# Patient Record
Sex: Female | Born: 1980 | Race: White | Hispanic: No | Marital: Married | Smoking: Never smoker
Health system: Southern US, Community
[De-identification: ages and names within clinical notes are randomized; demographics above are authoritative.]

## PROBLEM LIST (undated history)

## (undated) DIAGNOSIS — K219 Gastro-esophageal reflux disease without esophagitis: Secondary | ICD-10-CM

## (undated) DIAGNOSIS — R51 Headache: Secondary | ICD-10-CM

## (undated) DIAGNOSIS — R519 Headache, unspecified: Secondary | ICD-10-CM

## (undated) DIAGNOSIS — IMO0002 Reserved for concepts with insufficient information to code with codable children: Secondary | ICD-10-CM

## (undated) DIAGNOSIS — M7731 Calcaneal spur, right foot: Secondary | ICD-10-CM

## (undated) DIAGNOSIS — F419 Anxiety disorder, unspecified: Secondary | ICD-10-CM

## (undated) DIAGNOSIS — O039 Complete or unspecified spontaneous abortion without complication: Secondary | ICD-10-CM

## (undated) DIAGNOSIS — R112 Nausea with vomiting, unspecified: Secondary | ICD-10-CM

## (undated) DIAGNOSIS — F32A Depression, unspecified: Secondary | ICD-10-CM

## (undated) DIAGNOSIS — N809 Endometriosis, unspecified: Secondary | ICD-10-CM

## (undated) DIAGNOSIS — D171 Benign lipomatous neoplasm of skin and subcutaneous tissue of trunk: Secondary | ICD-10-CM

## (undated) DIAGNOSIS — Z9889 Other specified postprocedural states: Secondary | ICD-10-CM

## (undated) HISTORY — DX: Benign lipomatous neoplasm of skin and subcutaneous tissue of trunk: D17.1

## (undated) HISTORY — DX: Anxiety disorder, unspecified: F41.9

## (undated) HISTORY — DX: Endometriosis, unspecified: N80.9

## (undated) HISTORY — DX: Reserved for concepts with insufficient information to code with codable children: IMO0002

## (undated) HISTORY — DX: Calcaneal spur, right foot: M77.31

## (undated) HISTORY — DX: Complete or unspecified spontaneous abortion without complication: O03.9

---

## 2006-09-23 HISTORY — PX: OVARIAN CYST REMOVAL: SHX89

## 2009-05-02 HISTORY — PX: OVARIAN CYST REMOVAL: SHX89

## 2011-03-23 DIAGNOSIS — O039 Complete or unspecified spontaneous abortion without complication: Secondary | ICD-10-CM

## 2011-03-23 HISTORY — DX: Complete or unspecified spontaneous abortion without complication: O03.9

## 2011-12-15 HISTORY — PX: LEFT OOPHORECTOMY: SHX1961

## 2014-02-06 ENCOUNTER — Encounter: Payer: Self-pay | Admitting: Obstetrics & Gynecology

## 2014-02-16 ENCOUNTER — Encounter: Payer: Self-pay | Admitting: Obstetrics and Gynecology

## 2014-02-27 ENCOUNTER — Encounter: Payer: Self-pay | Admitting: Obstetrics & Gynecology

## 2014-02-27 ENCOUNTER — Ambulatory Visit (INDEPENDENT_AMBULATORY_CARE_PROVIDER_SITE_OTHER): Payer: No Typology Code available for payment source | Admitting: Obstetrics & Gynecology

## 2014-02-27 VITALS — BP 124/87 | HR 80 | Ht 63.0 in | Wt 231.0 lb

## 2014-02-27 DIAGNOSIS — N809 Endometriosis, unspecified: Secondary | ICD-10-CM

## 2014-02-27 DIAGNOSIS — Z01419 Encounter for gynecological examination (general) (routine) without abnormal findings: Secondary | ICD-10-CM

## 2014-02-27 DIAGNOSIS — Z124 Encounter for screening for malignant neoplasm of cervix: Secondary | ICD-10-CM

## 2014-02-27 DIAGNOSIS — N920 Excessive and frequent menstruation with regular cycle: Secondary | ICD-10-CM

## 2014-02-27 DIAGNOSIS — Z1151 Encounter for screening for human papillomavirus (HPV): Secondary | ICD-10-CM

## 2014-02-27 HISTORY — DX: Endometriosis, unspecified: N80.9

## 2014-02-27 NOTE — Patient Instructions (Signed)
Endometriosis Endometriosis is a condition in which the tissue that lines the uterus (endometrium) grows outside of its normal location. The tissue may grow in many locations close to the uterus, but it commonly grows on the ovaries, fallopian tubes, vagina, or bowel. Because the uterus expels, or sheds, its lining every menstrual cycle, there is bleeding wherever the endometrial tissue is located. This can cause pain because blood is irritating to tissues not normally exposed to it.  CAUSES  The cause of endometriosis is not known.  SIGNS AND SYMPTOMS  Often, there are no symptoms. When symptoms are present, they can vary with the location of the displaced tissue. Various symptoms can occur at different times. Although symptoms occur mainly during a woman's menstrual period, they can also occur midcycle and usually stop with menopause. Some people may go months with no symptoms at all. Symptoms may include:   Back or abdominal pain.   Heavier bleeding during periods.   Pain during intercourse.   Painful bowel movements.   Infertility. DIAGNOSIS  Your health care provider will do a physical exam and ask about your symptoms. Various tests may be done, such as:   Blood tests and urine tests. These are done to help rule out other problems.   Ultrasound. This test is done to look for abnormal tissue.   An X-ray of the lower bowel (barium enema).  Laparoscopy. In this procedure, a thin, lighted tube with a tiny camera on the end (laparoscope) is inserted into your abdomen. This helps your health care provider look for abnormal tissue to confirm the diagnosis. The health care provider may also remove a small piece of tissue (biopsy) from any abnormal tissue found. This tissue sample can then be sent to a lab so it can be looked at under a microscope. TREATMENT  Treatment will vary and may include:   Medicines to relieve pain. Nonsteroidal anti-inflammatory drugs (NSAIDs) are a type of  pain medicine that can help to relieve the pain caused by endometriosis.  Hormonal therapy. When using hormonal therapy, periods are eliminated. This eliminates the monthly exposure to blood by the displaced endometrial tissue.   Surgery. Surgery may sometimes be done to remove the abnormal endometrial tissue. In severe cases, surgery may be done to remove the fallopian tubes, uterus, and ovaries (hysterectomy). HOME CARE INSTRUCTIONS   Only take over-the-counter or prescription medicines for pain, discomfort, or fever as directed by your health care provider. Do not take aspirin because it may increase bleeding when you are not on hormonal therapy.   Avoid activities that produce pain, including sexual activity. SEEK MEDICAL CARE IF:  You have pelvic pain before, after, or during your periods.  You have pelvic pain between periods that gets worse during your period.  You have pelvic pain during or after sex.  You have pelvic pain with bowel movements or urination, especially during your period.  You have problems getting pregnant. SEEK IMMEDIATE MEDICAL CARE IF:   Your pain is severe and is not responding to pain medicine.   You have severe nausea and vomiting, or you cannot keep foods down.   You have pain that is limited to the right lower part of your abdomen.   You have swelling or increasing pain in your abdomen.   You see blood in your stool.   You have a fever or persistent symptoms for more than 2 3 days.   You have a fever and your symptoms suddenly get worse. MAKE SURE YOU:  Understand these instructions.  Will watch your condition.  Will get help right away if you are not doing well or get worse. Document Released: 11/27/2000 Document Revised: 09/20/2013 Document Reviewed: 07/28/2013 [employer] Patient Information 2014 [employer].  

## 2014-02-27 NOTE — Progress Notes (Signed)
Patient ID: Dawn Cain, female   DOB: 09-09-1981, 33 y.o.   MRN: [id number] Subjective:     Dawn Cain is a 33 y.o. female here for a routine exam.  Current complaints: ovulation pain.  Pt has spotting 1 week prior to her cycles.  Has had heavier bleeding with cycles.  Desires pregnancy.     Gynecologic History Patient's last menstrual period was 02/14/2014. Contraception: none Last Pap: 2013. Results were: normal h/o abnormal PAP prev ?2012 s/p colpo and bx   Obstetric History OB History  Gravida Para Term Preterm AB SAB TAB Ectopic Multiple Living  1 0 0 0 1 1 0 0 0 0     # Outcome Date GA Lbr Len/2nd Weight Sex Delivery Anes PTL Lv  1 SAB              Past Medical History  Diagnosis Date  . Infertility     trying for 11 years.  . Miscarriage 03/23/2011  h/o endometriosis  Past Surgical History  Procedure Laterality Date  . 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 Ovarian cyst removal Left 05/02/2009  . Ovarian cyst removal Left 09/23/2006   No current outpatient prescriptions on file prior to visit.   No current facility-administered medications on file prior to visit.       The following portions of the patient's history were reviewed and updated as appropriate: allergies, current medications, past family history, past medical history, past social history, past surgical history and problem list.  Review of Systems pt with h/o endometriosis    Objective:    BP 124/87  Pulse 80  Ht 5\' 3"  (1.6 m)  Wt 231 lb (104.781 kg)  BMI 40.93 kg/m2  LMP 02/14/2014  General Appearance:    Alert, cooperative, no distress, appears stated age  Head:    Normocephalic, without obvious abnormality, atraumatic              Neck:   Supple, symmetrical, trachea midline, no adenopathy;    thyroid:  no enlargement/tenderness/nodules; no carotid   bruit or JVD  Back:     Symmetric, no curvature, ROM normal, no CVA tenderness  Lungs:     Clear to  auscultation bilaterally, respirations unlabored  Chest Wall:    No tenderness or deformity   Heart:    Regular rate and rhythm, S1 and S2 normal, no murmur, rub   or gallop  Breast Exam:    No tenderness, masses, or nipple abnormality  Abdomen:     Soft, non-tender, bowel sounds active all four quadrants,    no masses, no organomegaly  Genitalia:    Normal female without lesion, discharge or tenderness     Extremities:   Extremities normal, atraumatic, no cyanosis or edema   Skin: multiple well healed marks on skin- face; breast; legs and chest                Assessment:    Healthy female exam.  irreg cycles/menorrhagia   Plan:    Follow up in: 1 year.  for annaul F/u 3 months to review sono sono to check endometrium/ r/o uterine pathology

## 2014-03-01 LAB — PAP LB, RFX HPV ALL PTH: PAP Smear Comment: 0

## 2014-03-09 ENCOUNTER — Ambulatory Visit (HOSPITAL_COMMUNITY)
Admission: RE | Admit: 2014-03-09 | Discharge: 2014-03-09 | Disposition: A | Discharge status: Home / Self Care | Payer: No Typology Code available for payment source | Source: Ambulatory Visit | Attending: Obstetrics & Gynecology | Admitting: Obstetrics & Gynecology

## 2014-03-09 DIAGNOSIS — N920 Excessive and frequent menstruation with regular cycle: Secondary | ICD-10-CM | POA: Insufficient documentation

## 2014-03-09 DIAGNOSIS — D259 Leiomyoma of uterus, unspecified: Secondary | ICD-10-CM | POA: Insufficient documentation

## 2014-03-09 DIAGNOSIS — N809 Endometriosis, unspecified: Secondary | ICD-10-CM | POA: Insufficient documentation

## 2014-03-09 DIAGNOSIS — R630 Anorexia: Secondary | ICD-10-CM | POA: Insufficient documentation

## 2014-03-15 ENCOUNTER — Telehealth: Payer: Self-pay | Admitting: *Deleted

## 2014-03-15 NOTE — Telephone Encounter (Addendum)
Message copied by Erik Obey on Thu Mar 15, 2014 10:32 AM ------      Message from: Lavonia Drafts      Created: Fri Mar 09, 2014  5:00 PM       Please call pt and notify her of normal sono.            [id number] ------Called patient and left message regarding normal results.  She will call back if she would like further detailed information.

## 2014-03-23 ENCOUNTER — Encounter: Payer: Self-pay | Admitting: Obstetrics & Gynecology

## 2014-03-23 ENCOUNTER — Ambulatory Visit (INDEPENDENT_AMBULATORY_CARE_PROVIDER_SITE_OTHER): Payer: No Typology Code available for payment source | Admitting: Obstetrics & Gynecology

## 2014-03-23 VITALS — BP 125/84 | HR 86 | Ht 63.0 in | Wt 230.0 lb

## 2014-03-23 DIAGNOSIS — N926 Irregular menstruation, unspecified: Secondary | ICD-10-CM

## 2014-03-23 MED ORDER — NORETHIN ACE-ETH ESTRAD-FE 1-20 MG-MCG(24) PO TABS: 1.0000 | ORAL_TABLET | Freq: Every day | ORAL | Status: DC

## 2014-03-23 MED ORDER — NAPROXEN 500 MG PO TABS: 500.0000 mg | ORAL_TABLET | Freq: Three times a day (TID) | ORAL | Status: DC

## 2014-03-23 NOTE — Progress Notes (Signed)
Subjective:     Patient ID: Dawn Cain, female   DOB: [birth year], 33 y.o.   MRN: [id number]  HPI 21 you [id number] presents for f/u of sono results.  She reports that her cycles are becoming shorter intervals but, she feel pain during her cycles and has 1 week of spotting prior to menses that is assoc with pain.      Review of Systems     Objective:   Physical Exam BP 125/84  Pulse 86  Ht 5\' 3"  (1.6 m)  Wt 230 lb (104.327 kg)  BMI 40.75 kg/m2  LMP 03/10/2014 Exam deferred  03/09/2014 EXAM:  TRANSABDOMINAL AND TRANSVAGINAL ULTRASOUND OF PELVIS  TECHNIQUE:  Both transabdominal and transvaginal ultrasound examinations of the  pelvis were performed. Transabdominal technique was performed for  global imaging of the pelvis including uterus, ovaries, adnexal  regions, and pelvic cul-de-sac. It was necessary to proceed with  endovaginal exam following the transabdominal exam to visualize the  endometrium and right ovary.  COMPARISON: None  FINDINGS:  Uterus  Measurements: 7.4 x 4.7 x 4.6 cm. Small 1.1 x 1.0 x 1.2 cm fibroid  left fundus.  Endometrium  Thickness:11.1 mm. Motion was noted within the endometrium  consistent with the patient's current active bleeding.  Right ovary  Measurements: 3.2 x 2.3 x 3.0 cm. Normal appearance/no adnexal mass.  Left ovary  Left Oophorectomy.  Other findings  Trace free fluid.  IMPRESSION:  1. Small 1.1 x 1.0 x 1.2 cm fibroid left fundus.  2. Patient was actively bleeding during the exam. Motion noted  within the endometrial canal. Endometrial thickness is 11.1 mm.  3. Left oophorectomy. Right ovary is unremarkable.  4. Trace free pelvic fluid.  .      Assessment:     Dysmenorrhea- h/o endometriosis  Irreg cycles- will cycle for 3 months on OCP's to attempt to regulate her cycles.  Consider Lupron if she is not able to see REI soon    Plan:     LoEstrin 24 1 po q day for 3 months Naproxen 500tid prn F/u in 4 months Pt to be referred  to [facility] as soon as she changes ins

## 2014-03-23 NOTE — Patient Instructions (Addendum)
Uterine Fibroid A uterine fibroid is a growth (tumor) that occurs in your uterus. This type of tumor is not cancerous and does not spread out of the uterus. You can have one or many fibroids. Fibroids can vary in size, weight, and where they grow in the uterus. Some can become quite large. Most fibroids do not require medical treatment, but some can cause pain or heavy bleeding during and between periods. CAUSES  A fibroid is the result of a single uterine cell that keeps growing (unregulated), which is different than most cells in the human body. Most cells have a control mechanism that keeps them from reproducing without control.  SIGNS AND SYMPTOMS   Bleeding.  Pelvic pain and pressure.  Bladder problems due to the size of the fibroid.  Infertility and miscarriages depending on the size and location of the fibroid. DIAGNOSIS  Uterine fibroids are diagnosed through a physical exam. Your health care provider may feel the lumpy tumors during a pelvic exam. Ultrasonography may be done to get information regarding size, location, and number of tumors.  TREATMENT   Your health care provider may recommend watchful waiting. This involves getting the fibroid checked by your health care provider to see if it grows or shrinks.   Hormone treatment or an intrauterine device (IUD) may be prescribed.   Surgery may be needed to remove the fibroids (myomectomy) or the uterus (hysterectomy). This depends on your situation. When fibroids interfere with fertility and a woman wants to become pregnant, a health care provider may recommend having the fibroids removed.  [place] care depends on how you were treated. In general:   Keep all follow-up appointments with your health care provider.   Only take over-the-counter or prescription medicines as directed by your health care provider. If you were prescribed a hormone treatment, take the hormone medicines exactly as directed. Do not  take aspirin. It can cause bleeding.   Talk to your health care provider about taking iron pills.  If your periods are troublesome but not so heavy, lie down with your feet raised slightly above your heart. Place cold packs on your lower abdomen.   If your periods are heavy, write down the number of pads or tampons you use per month. Bring this information to your health care provider.   Include green vegetables in your diet.  SEEK IMMEDIATE MEDICAL CARE IF:  You have pelvic pain or cramps not controlled with medicines.   You have a sudden increase in pelvic pain.   You have an increase in bleeding between and during periods.   You have excessive periods and soak tampons or pads in a half hour or less.  You feel lightheaded or have fainting episodes. Document Released: 11/27/2000 Document Revised: 09/20/2013 Document Reviewed: 06/29/2013 [facility] Patient Information 2014 [place], [place]. Dysmenorrhea Menstrual cramps (dysmenorrhea) are caused by the muscles of the uterus tightening (contracting) during a menstrual period. For some women, this discomfort is merely bothersome. For others, dysmenorrhea can be severe enough to interfere with everyday activities for a few days each month. Primary dysmenorrhea is menstrual cramps that last a couple of days when you start having menstrual periods or soon after. This often begins after a teenager starts having her period. As a woman gets older or has a baby, the cramps will usually lessen or disappear. Secondary dysmenorrhea begins later in life, lasts longer, and the pain may be stronger than primary dysmenorrhea. The pain may start before the period and  last a few days after the period.  CAUSES  Dysmenorrhea is usually caused by an underlying problem, such as:  The tissue lining the uterus grows outside of the uterus in other areas of the body (endometriosis).  The endometrial tissue, which normally lines the uterus, is found in or  grows into the muscular walls of the uterus (adenomyosis).  The pelvic blood vessels are engorged with blood just before the menstrual period (pelvic congestive syndrome).  Overgrowth of cells (polyps) in the lining of the uterus or cervix.  Falling down of the uterus (prolapse) because of loose or stretched ligaments.  Depression.  Bladder problems, infection, or inflammation.  Problems with the intestine, a tumor, or irritable bowel syndrome.  Cancer of the female organs or bladder.  A severely tipped uterus.  A very tight opening or closed cervix.  Noncancerous tumors of the uterus (fibroids).  Pelvic inflammatory disease (PID).  Pelvic scarring (adhesions) from a previous surgery.  Ovarian cyst.  An intrauterine device (IUD) used for birth control. RISK FACTORS You may be at greater risk of dysmenorrhea if:  You are younger than age 49.  You started puberty early.  You have irregular or heavy bleeding.  You have never given birth.  You have a family history of this problem.  You are a smoker. SIGNS AND SYMPTOMS   Cramping or throbbing pain in your lower abdomen.  Headaches.  Lower back pain.  Nausea or vomiting.  Diarrhea.  Sweating or dizziness.  Loose stools. DIAGNOSIS  A diagnosis is based on your history, symptoms, physical exam, diagnostic tests, or procedures. Diagnostic tests or procedures may include:  Blood tests.  Ultrasonography.  An examination of the lining of the uterus (dilation and curettage, D&C).  An examination inside your abdomen or pelvis with a scope (laparoscopy).  X-rays.  CT scan.  MRI.  An examination inside the bladder with a scope (cystoscopy).  An examination inside the intestine or stomach with a scope (colonoscopy, gastroscopy). TREATMENT  Treatment depends on the cause of the dysmenorrhea. Treatment may include:  Pain medicine prescribed by your health care provider.  Birth control pills or an IUD  with progesterone hormone in it.  Hormone replacement therapy.  Nonsteroidal anti-inflammatory drugs (NSAIDs). These may help stop the production of prostaglandins.  Surgery to remove adhesions, endometriosis, ovarian cyst, or fibroids.  Removal of the uterus (hysterectomy).  Progesterone shots to stop the menstrual period.  Cutting the nerves on the sacrum that go to the female organs (presacral neurectomy).  Electric current to the sacral nerves (sacral nerve stimulation).  Antidepressant medicine.  Psychiatric therapy, counseling, or group therapy.  Exercise and physical therapy.  Meditation and yoga therapy.  Acupuncture. HOME CARE INSTRUCTIONS   Only take over-the-counter or prescription medicines as directed by your health care provider.  Place a heating pad or hot water bottle on your lower back or abdomen. Do not sleep with the heating pad.  Use aerobic exercises, walking, swimming, biking, and other exercises to help lessen the cramping.  Massage to the lower back or abdomen may help.  Stop smoking.  Avoid alcohol and caffeine. SEEK MEDICAL CARE IF:   Your pain does not get better with medicine.  You have pain with sexual intercourse.  Your pain increases and is not controlled with medicines.  You have abnormal vaginal bleeding with your period.  You develop nausea or vomiting with your period that is not controlled with medicine. SEEK IMMEDIATE MEDICAL CARE IF:  You pass out.  Document Released: 11/30/2005 Document Revised: 08/02/2013 Document Reviewed: 05/18/2013 [facility] Patient Information 2014 [place].

## 2014-04-20 ENCOUNTER — Telehealth: Payer: Self-pay | Admitting: *Deleted

## 2014-04-20 DIAGNOSIS — N926 Irregular menstruation, unspecified: Secondary | ICD-10-CM

## 2014-04-20 MED ORDER — NORGESTIMATE-ETH ESTRADIOL 0.25-35 MG-MCG PO TABS: 1.0000 | ORAL_TABLET | Freq: Every day | ORAL | Status: DC

## 2014-04-20 NOTE — Telephone Encounter (Signed)
Patient has been taking Lo estrin for two weeks and is having increased headaches.  She would like to change to a different ocp.  Her BP is good at 116/69.  We will call in ortho cyclen for her and she will go ahead and stop her current pill and switch to the new pill.

## 2014-05-01 ENCOUNTER — Ambulatory Visit (INDEPENDENT_AMBULATORY_CARE_PROVIDER_SITE_OTHER): Payer: PRIVATE HEALTH INSURANCE

## 2014-05-01 ENCOUNTER — Ambulatory Visit (INDEPENDENT_AMBULATORY_CARE_PROVIDER_SITE_OTHER): Payer: PRIVATE HEALTH INSURANCE | Admitting: Podiatry

## 2014-05-01 ENCOUNTER — Encounter: Payer: Self-pay | Admitting: Podiatry

## 2014-05-01 VITALS — BP 174/95 | HR 85 | Resp 16 | Ht 64.0 in | Wt 230.0 lb

## 2014-05-01 DIAGNOSIS — M79609 Pain in unspecified limb: Secondary | ICD-10-CM

## 2014-05-01 DIAGNOSIS — M779 Enthesopathy, unspecified: Secondary | ICD-10-CM

## 2014-05-01 DIAGNOSIS — M79673 Pain in unspecified foot: Secondary | ICD-10-CM

## 2014-05-01 MED ORDER — TRIAMCINOLONE ACETONIDE 10 MG/ML IJ SUSP
10.0000 mg | Freq: Once | INTRAMUSCULAR | Status: AC
Start: 2014-05-01 — End: 2014-05-01
  Administered 2014-05-01: 10 mg

## 2014-05-01 NOTE — Progress Notes (Signed)
Subjective:     Patient ID: Dawn Cain, female   DOB: 1981/07/17, 33 y.o.   MRN: [id number]  Foot Pain   patient states I'm having a lot of pain at the base of my second toe left foot. For the last month or 2. I did start wearing sketchers which seemed to be a problem and I no longer wearing them but the pain continues   Review of Systems  All other systems reviewed and are negative.      Objective:   Physical Exam  Nursing note and vitals reviewed. Constitutional: She is oriented to person, place, and time.  Cardiovascular: Intact distal pulses.   Musculoskeletal: Normal range of motion.  Neurological: She is oriented to person, place, and time.  Skin: Skin is warm.   neurovascular status intact with muscle strength adequate and range of motion of the subtalar and midtarsal joint within normal limits. Patient is found to have well-developed arch and good digital perfusion and I did note intense discomfort second metatarsal phalangeal joint left foot     Assessment:     Acute capsulitis of the left second metatarsophalangeal joint    Plan:     H&P performed x-ray reviewed and today I did a proximal nerve block explained the risk of injection and aspirated the second MPJ taking a small amount of clear fluid and injected with a quarter cc of dexamethasone Kenalog and applied padding to reduce stress against the joint. Placed on Voltaren 75 mg twice a day and reappoint in 1 week for probable consideration of orthotic therapy

## 2014-05-01 NOTE — Progress Notes (Signed)
   Subjective:    Patient ID: Dawn Cain, female    DOB: 1981/01/11, 33 y.o.   MRN: [id number]  HPI Comments: Its the base of my 2nd toe that hurts on my left foot. Its been hurting for 3 - 4 weeks. The pain has remained the same. It hurts when im up on my feet. i take the naproxen and it hasnt helped.   Foot Pain      Review of Systems  Musculoskeletal:       Joint pain  All other systems reviewed and are negative.      Objective:   Physical Exam        Assessment & Plan:

## 2014-05-08 ENCOUNTER — Ambulatory Visit: Payer: Self-pay | Admitting: Podiatry

## 2014-05-08 ENCOUNTER — Ambulatory Visit (INDEPENDENT_AMBULATORY_CARE_PROVIDER_SITE_OTHER): Payer: PRIVATE HEALTH INSURANCE | Admitting: Podiatry

## 2014-05-08 ENCOUNTER — Encounter: Payer: Self-pay | Admitting: Podiatry

## 2014-05-08 DIAGNOSIS — M779 Enthesopathy, unspecified: Secondary | ICD-10-CM

## 2014-05-08 NOTE — Progress Notes (Signed)
Subjective:     Patient ID: Dawn Cain, female   DOB: [birth year], 33 y.o.   MRN: [id number]  HPI patient states the pain is better than last week but still sore with activity   Review of Systems     Objective:   Physical Exam Neurovascular status intact with significant discomfort reduction in the second metatarsophalangeal joint left with no indication of digital deformity    Assessment:     Improving capsulitis second MPJ left    Plan:     Reviewed condition and recommended long-term orthotics and scanned for custom orthotic devices to reduce stress and also discussed rigid bottom shoes

## 2014-05-18 ENCOUNTER — Encounter: Payer: Self-pay | Admitting: *Deleted

## 2014-05-18 NOTE — Progress Notes (Signed)
Sent pt post card letting her know orthotics are here.

## 2014-05-25 ENCOUNTER — Ambulatory Visit (INDEPENDENT_AMBULATORY_CARE_PROVIDER_SITE_OTHER): Payer: PRIVATE HEALTH INSURANCE | Admitting: *Deleted

## 2014-05-25 VITALS — BP 126/77 | HR 80 | Resp 16

## 2014-05-25 DIAGNOSIS — M779 Enthesopathy, unspecified: Secondary | ICD-10-CM

## 2014-05-25 NOTE — Progress Notes (Signed)
Pt presents today to pick up orthotics. Went over wearing instructions with pt.

## 2014-05-25 NOTE — Patient Instructions (Signed)

## 2014-10-31 ENCOUNTER — Encounter: Payer: Self-pay | Admitting: Advanced Practice Midwife

## 2014-10-31 ENCOUNTER — Ambulatory Visit (INDEPENDENT_AMBULATORY_CARE_PROVIDER_SITE_OTHER): Payer: 59 | Admitting: Advanced Practice Midwife

## 2014-10-31 VITALS — BP 131/77 | HR 72 | Ht 64.0 in | Wt 226.0 lb

## 2014-10-31 DIAGNOSIS — N915 Oligomenorrhea, unspecified: Secondary | ICD-10-CM

## 2014-10-31 NOTE — Progress Notes (Signed)
  Subjective:    Dawn Cain is a 33 y.o. female who presents for evaluation of menstrual periods that have been much shorter and lighter than usual cycles x 2 cycles. She has aslo not been passing clots and experiencing dysmenorrhea that she usually has w/ her cycles.  Currently periods are occurring every 4 weeks. Bleeding is light. Periods were not regular in the past occurring every 3 weeks. She had been started on OCPs for cycle control 03/2014, but stopped a few months later due to intolerance of side effects.    Patient has no relevant history of abnormal sexual development or thyroid disease. She has a Hx of infertility x 11 years, ovarian cysts and an oophorectomy for an ovarian mass. Is there a chance of pregnancy? yes. Has not taken home UPT. UPT in office today negative. Factors that may be contributory to menstrual abnormalities include: recent stressors moving.. Previous treatments for menstrual abnormalities include: oral contraceptive pills, somewhat effective.  Menstrual History: OB History    Gravida Para Term Preterm AB TAB SAB Ectopic Multiple Living   1 0 0 0 1 0 1 0 0 0       Patient's last menstrual period was 10/20/2014 (exact date).    The following portions of the patient's history were reviewed and updated as appropriate: allergies, current medications, past family history, past medical history, past social history, past surgical history and problem list.  Review of Systems Endocrine: positive for fertility problems, negative for temperature intolerance and weight change, change in hair or nails.    Objective:    BP 131/77 mmHg  Pulse 72  Ht 5\' 4"  (1.626 m)  Wt 226 lb (102.513 kg)  BMI 38.77 kg/m2  LMP 10/20/2014 (Exact Date) General:   alert, cooperative, appears stated age, no distress and moderately obese  Skin:   facial acne  HEENT:   Pt left prior to thyroid exam. No visible goiter.  Heart:   regular rate   Lab Review Urine pregnancy test and TSH     Assessment:    The patient has hypomenorrhea .    Plan:    If TSH normal, F/U PRN. If abnormal, needs thyroid panel.   Refer to Dr. Hughie Closs for infertility.  [place], [place] 10/31/2014 3:08 PM

## 2014-10-31 NOTE — Patient Instructions (Addendum)
Abnormal Uterine Bleeding Abnormal uterine bleeding can affect women at various stages in life, including teenagers, women in their reproductive years, pregnant women, and women who have reached menopause. Several kinds of uterine bleeding are considered abnormal, including:  Bleeding or spotting between periods.   Bleeding after sexual intercourse.   Bleeding that is heavier or more than normal.   Periods that last longer than usual.  Bleeding after menopause.  Many cases of abnormal uterine bleeding are minor and simple to treat, while others are more serious. Any type of abnormal bleeding should be evaluated by your health care provider. Treatment will depend on the cause of the bleeding. HOME CARE INSTRUCTIONS Monitor your condition for any changes. The following actions may help to alleviate any discomfort you are experiencing:  Avoid the use of tampons and douches as directed by your health care provider.  Change your pads frequently. You should get regular pelvic exams and Pap tests. Keep all follow-up appointments for diagnostic tests as directed by your health care provider.  SEEK MEDICAL CARE IF:   Your bleeding lasts more than 1 week.   You feel dizzy at times.  SEEK IMMEDIATE MEDICAL CARE IF:   You pass out.   You are changing pads every 15 to 30 minutes.   You have abdominal pain.  You have a fever.   You become sweaty or weak.   You are passing large blood clots from the vagina.   You start to feel nauseous and vomit. MAKE SURE YOU:   Understand these instructions.  Will watch your condition.  Will get help right away if you are not doing well or get worse. Document Released: 11/30/2005 Document Revised: 12/05/2013 Document Reviewed: 06/29/2013 [facility] Patient Information 2015 [place], [place]. This information is not intended to replace advice given to you by your health care provider. Make sure you discuss any questions you have with your  health care provider.  Infertility WHAT IS INFERTILITY?  Infertility is usually defined as not being able to get pregnant after trying for one year of regular sexual intercourse without the use of contraceptives. Or not being able to carry a pregnancy to term and have a baby. The infertility rate in the [place] is around 10%. Pregnancy is the result of a chain of events. A woman must release an egg from one of her ovaries (ovulation). The egg must be fertilized by the female sperm. Then it travels through a fallopian tube into the uterus (womb), where it attaches to the wall of the uterus and grows. A man must have enough sperm, and the sperm must join with (fertilize) the egg along the way, at the proper time. The fertilized egg must then become attached to the inside of the uterus. While this may seem simple, many things can happen to prevent pregnancy from occurring.  WHOSE PROBLEM IS IT?  About 20% of infertility cases are due to problems with the man (female factors) and 65% are due to problems with the woman (female factors). Other cases are due to a combination of female and female factors or to unknown causes.  WHAT CAUSES INFERTILITY IN MEN?  Infertility in men is often caused by problems with making enough normal sperm or getting the sperm to reach the egg. Problems with sperm may exist from birth or develop later in life, due to illness or injury. Some men produce no sperm, or produce too few sperm (oligospermia). Other problems include:  Sexual dysfunction.  Hormonal or endocrine problems.  Age. Female fertility decreases with age, but not at as young an age as female fertility.  Infection.  Congenital problems. Birth defect, such as absence of the tubes that carry the sperm (vas deferens).  Genetic/chromosomal problems.  Antisperm antibody problems.  Retrograde ejaculation (sperm go into the bladder).  Varicoceles, spematoceles, or tumors of the testicles.  Lifestyle can  influence the number and quality of a man's sperm.  Alcohol and drugs can temporarily reduce sperm quality.  Environmental toxins, including pesticides and lead, may cause some cases of infertility in men. WHAT CAUSES INFERTILITY IN WOMEN?   Problems with ovulation account for most infertility in women. Without ovulation, eggs are not available to be fertilized.  Signs of problems with ovulation include irregular menstrual periods or no periods at all.  Simple lifestyle factors, including stress, diet, or athletic training, can affect a woman's hormonal balance.  Age. Fertility begins to decrease in women in the early 60s and is worse after age 73.  Much less often, a hormonal imbalance from a serious medical problem, such as a pituitary gland tumor, thyroid or other chronic medical disease, can cause ovulation problems.  Pelvic infections.  Polycystic ovary syndrome (increase in female hormones, unable to ovulate).  Alcohol or illegal drugs.  Environmental toxins, radiation, pesticides, and certain chemicals.  Aging is an important factor in female infertility.  The ability of a woman's ovaries to produce eggs declines with age, especially after age 5. About one third of couples where the woman is over 57 will have problems with fertility.  By the time she reaches menopause when her monthly periods stop for good, a woman can no longer produce eggs or become pregnant.  Other problems can also lead to infertility in women. If the fallopian tubes are blocked at one or both ends, the egg cannot travel through the tubes into the uterus. Scar tissue (adhesions) in the pelvis may cause blocked tubes. This may result from pelvic inflammatory disease, endometriosis, or surgery for an ectopic pregnancy (fertilized egg implanted outside the uterus) or any pelvic or abdominal surgery causing adhesions.  Fibroid tumors or polyps of the uterus.  Congenital (birth defect) abnormalities of the  uterus.  Infection of the cervix (cervicitis).  Cervical stenosis (narrowing).  Abnormal cervical mucus.  Polycystic ovary syndrome.  Having sexual intercourse too often (every other day or 4 to 5 times a week).  Obesity.  Anorexia.  Poor nutrition.  Over exercising, with loss of body fat.  DES. Your mother received diethylstilbesterol hormone when pregnant with you. HOW IS INFERTILITY TESTED?  If you have been trying to have a baby without success, you may want to seek medical help. You should not wait for one year of trying before seeing a health care provider if:  You are over 35.  You have reason to believe that there may be a fertility problem. A medical evaluation may determine the reasons for a couple's infertility. Usually this process begins with:  Physical exams.  Medical histories of both partners.  Sexual histories of both partners. If there is no obvious problem, like improperly timed intercourse or absence of ovulation, tests may be needed.   For a man, testing usually begins with tests of his semen to look at:  The number of sperm.  The shape of sperm.  Movement of his sperm.  Taking a complete medical and surgical history.  Physical examination.  Check for infection of the female reproductive organs. Sometimes hormone tests are done.  For a woman, the first step in testing is to find out if she is ovulating each month. There are several ways to do this. For example, she can keep track of changes in her morning body temperature and in the texture of her cervical mucus. Another tool is a home ovulation test kit, which can be bought at drug or grocery stores.  Checks of ovulation can also be done in the doctor's office, using blood tests for hormone levels or ultrasound tests of the ovaries. If the woman is ovulating, more tests will need to be done. Some common female tests include:  Hysterosalpingogram: An x-ray of the fallopian tubes and uterus  after they are injected with dye. It shows if the tubes are open and shows the shape of the uterus.  Laparoscopy: An exam of the tubes and other female organs for disease. A lighted tube called a laparoscope is used to see inside the abdomen.  Endometrial biopsy: Sample of uterus tissue taken on the first day of the menstrual period, to see if the tissue indicates you are ovulating.  Transvaginal ultrasound: Examines the female organs.  Hysteroscopy: Uses a lighted tube to examine the cervix and inside the uterus, to see if there are any abnormalities inside the uterus. TREATMENT  Depending on the test results, different treatments can be suggested. The type of treatment depends on the cause. 85 to 90% of infertility cases are treated with drugs or surgery.   Various fertility drugs may be used for women with ovulation problems. It is important to talk with your caregiver about the drug to be used. You should understand the drug's benefits and side effects. Depending on the type of fertility drug and the dosage of the drug used, multiple births (twins or multiples) can occur in some women.  If needed, surgery can be done to repair damage to a woman's ovaries, fallopian tubes, cervix, or uterus.  Surgery or medical treatment for endometriosis or polycystic ovary syndrome. Sometimes a man has an infertility problem that can be corrected with medicine or by surgery.  Intrauterine insemination (IUI) of sperm, timed with ovulation.  Change in lifestyle, if that is the cause (lose weight, increase exercise, and stop smoking, drinking excessively, or taking illegal drugs).  Other types of surgery:  Removing growths inside and on the uterus.  Removing scar tissue from inside of the uterus.  Fixing blocked tubes.  Removing scar tissue in the pelvis and around the female organs. WHAT IS ASSISTED REPRODUCTIVE TECHNOLOGY (ART)?  Assisted reproductive technology (ART) is another form of special  methods used to help infertile couples. ART involves handling both the woman's eggs and the man's sperm. Success rates vary and depend on many factors. ART can be expensive and time-consuming. But ART has made it possible for many couples to have children that otherwise would not have been conceived. Some methods are listed below:  In vitro fertilization (IVF). IVF is often used when a woman's fallopian tubes are blocked or when a man has low sperm counts. A drug is used to stimulate the ovaries to produce multiple eggs. Once mature, the eggs are removed and placed in a culture dish with the man's sperm for fertilization. After about 40 hours, the eggs are examined to see if they have become fertilized by the sperm and are dividing into cells. These fertilized eggs (embryos) are then placed in the woman's uterus. This bypasses the fallopian tubes.  Gamete intrafallopian transfer (GIFT) is similar to IVF, but used  when the woman has at least one normal fallopian tube. Three to five eggs are placed in the fallopian tube, along with the man's sperm, for fertilization inside the woman's body.  Zygote intrafallopian transfer (ZIFT), also called tubal embryo transfer, combines IVF and GIFT. The eggs retrieved from the woman's ovaries are fertilized in the lab and placed in the fallopian tubes rather than in the uterus.  ART procedures sometimes involve the use of donor eggs (eggs from another woman) or previously frozen embryos. Donor eggs may be used if a woman has impaired ovaries or has a genetic disease that could be passed on to her baby.  When performing ART, you are at higher risk for resulting in multiple pregnancies, twins, triplets or more.  Intracytoplasma sperm injection is a procedure that injects a single sperm into the egg to fertilize it.  Embryo transplant is a procedure that starts after growing an embryo in a special media (chemical solution) developed to keep the embryo alive for 2 to 5  days, and then transplanting it into the uterus. In cases where a cause cannot be found and pregnancy does not occur, adoption may be a consideration. Document Released: 12/03/2003 Document Revised: 02/22/2012 Document Reviewed: 10/29/2009 [facility] Patient Information 2015 [place], [place]. This information is not intended to replace advice given to you by your health care provider. Make sure you discuss any questions you have with your health care provider.

## 2014-11-01 LAB — TSH: TSH: 2.3 u[IU]/mL (ref 0.450–4.500)

## 2015-02-26 ENCOUNTER — Ambulatory Visit: Payer: Self-pay | Admitting: Surgery

## 2015-06-05 ENCOUNTER — Encounter: Payer: Self-pay | Admitting: Internal Medicine

## 2015-06-05 DIAGNOSIS — M722 Plantar fascial fibromatosis: Secondary | ICD-10-CM | POA: Insufficient documentation

## 2015-06-05 DIAGNOSIS — G43829 Menstrual migraine, not intractable, without status migrainosus: Secondary | ICD-10-CM | POA: Insufficient documentation

## 2015-06-05 DIAGNOSIS — N946 Dysmenorrhea, unspecified: Secondary | ICD-10-CM | POA: Insufficient documentation

## 2015-06-05 DIAGNOSIS — D171 Benign lipomatous neoplasm of skin and subcutaneous tissue of trunk: Secondary | ICD-10-CM | POA: Insufficient documentation

## 2015-06-07 ENCOUNTER — Encounter: Payer: Self-pay | Admitting: Internal Medicine

## 2015-06-07 ENCOUNTER — Ambulatory Visit (INDEPENDENT_AMBULATORY_CARE_PROVIDER_SITE_OTHER): Payer: 59 | Admitting: Internal Medicine

## 2015-06-07 VITALS — BP 142/80 | HR 84 | Ht 64.0 in | Wt 227.8 lb

## 2015-06-07 DIAGNOSIS — G5603 Carpal tunnel syndrome, bilateral upper limbs: Secondary | ICD-10-CM

## 2015-06-07 DIAGNOSIS — G5601 Carpal tunnel syndrome, right upper limb: Secondary | ICD-10-CM | POA: Diagnosis not present

## 2015-06-07 DIAGNOSIS — G5602 Carpal tunnel syndrome, left upper limb: Secondary | ICD-10-CM | POA: Diagnosis not present

## 2015-06-07 NOTE — Progress Notes (Signed)
Date:  06/07/2015   Name:  Dawn Cain   DOB:  Apr 19, 1981   MRN:  [id number]   Chief Complaint: Hand Pain Hand Pain  The incident occurred 5 to 7 days ago. [place]. The injury mechanism was repetitive motion (while kayaking). The pain is present in the right hand and left hand. The quality of the pain is described as burning and aching. The pain radiates to the left hand and left arm. The pain has been intermittent since the incident. Associated symptoms include numbness and tingling. She has tried rest and acetaminophen for the symptoms. The treatment provided no relief (by report but the severe pain has resolved.).  She also reports intermittent symptoms in both hands,wrists and arm over the past few years.  They occur after bike riding and paddling.  Sometimes it is severe enough to radiate to her shoulders.  She has never had this evaluated.  She denies muscle weakness or permanent numbness.   Review of Systems:  Review of Systems  Musculoskeletal: Negative for joint swelling, arthralgias and neck stiffness.  Skin: Negative for color change.  Neurological: Positive for tingling and numbness. Negative for tremors and weakness.    Patient Active Problem List   Diagnosis Date Noted  . Dysmenorrhea 06/05/2015  . Plantar fasciitis 06/05/2015  . Headache, menstrual migraine 06/05/2015  . 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 Endometriosis 02/27/2014    Prior to Admission medications   Medication Sig Start Date End Date Taking? Authorizing Provider  almotriptan (AXERT) 12.5 MG tablet Take by mouth.   Yes Historical Provider, MD  Multiple Vitamin (MULTIVITAMIN) tablet Take 1 tablet by mouth daily.   Yes Historical Provider, MD  naproxen (NAPROSYN) 500 MG tablet Take 1 tablet (500 mg total) by mouth 3 (three) times daily with meals. 03/23/14  Yes Lavonia Drafts, MD    Allergies  Allergen Reactions  . Erythromycin Shortness Of Breath and Other (See Comments)  .  Imitrex  [Sumatriptan]     neck muscle tightness    Past Surgical History  Procedure Laterality Date  . 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 Ovarian cyst removal Left 05/02/2009  . Ovarian cyst removal Left 09/23/2006    History  Substance Use Topics  . Smoking status: Never Smoker   . Smokeless tobacco: Never Used  . Alcohol Use: 1.2 oz/week    2 Standard drinks or equivalent per week     Comment: rare     Medication list has been reviewed and updated.  Physical Examination:  Physical Exam  Constitutional: She appears well-developed and well-nourished. No distress.  Cardiovascular: Normal rate, regular rhythm and normal heart sounds.   Pulses:      Radial pulses are 2+ on the right side, and 2+ on the left side.  Pulmonary/Chest: Breath sounds normal. She has no wheezes.  Musculoskeletal:       Right shoulder: She exhibits no tenderness and no swelling.       Left shoulder: She exhibits no tenderness and no swelling.       Right elbow: She exhibits no swelling and no effusion.       Left elbow: She exhibits no swelling and no effusion.       Arms: Neurological: She has normal strength.    BP 142/80 mmHg  Pulse 84  Ht 5\' 4"  (1.626 m)  Wt 227 lb 12.8 oz (103.329 kg)  BMI 39.08 kg/m2  Assessment and Plan: 1. Carpal tunnel syndrome, bilateral Instructions given for wrist splint at night and Naproxen twice a day Will refer to Ortho if symptoms fail to improve   Halina Maidens, MD [facility]  06/07/2015

## 2015-06-07 NOTE — Patient Instructions (Signed)
Wear a wrist splint on each wrist at night while sleeping.  (cock-up wrist splint) Take naproxen twice a day  Carpal Tunnel Syndrome The carpal tunnel is a narrow area located on the palm side of your wrist. The tunnel is formed by the wrist bones and ligaments. Nerves, blood vessels, and tendons pass through the carpal tunnel. Repeated wrist motion or certain diseases may cause swelling within the tunnel. This swelling pinches the main nerve in the wrist (median nerve) and causes the painful hand and arm condition called carpal tunnel syndrome. CAUSES   Repeated wrist motions.  Wrist injuries.  Certain diseases like arthritis, diabetes, alcoholism, hyperthyroidism, and kidney failure.  Obesity.  Pregnancy. SYMPTOMS   A "pins and needles" feeling in your fingers or hand, especially in your thumb, index and middle fingers.  Tingling or numbness in your fingers or hand.  An aching feeling in your entire arm, especially when your wrist and elbow are bent for long periods of time.  Wrist pain that goes up your arm to your shoulder.  Pain that goes down into your palm or fingers.  A weak feeling in your hands. DIAGNOSIS  Your health care provider will take your history and perform a physical exam. An electromyography test may be needed. This test measures electrical signals sent out by your nerves into the muscles. The electrical signals are usually slowed by carpal tunnel syndrome. You may also need X-rays. TREATMENT  Carpal tunnel syndrome may clear up by itself. Your health care provider may recommend a wrist splint or medicine such as a nonsteroidal anti-inflammatory medicine. Cortisone injections may help. Sometimes, surgery may be needed to free the pinched nerve.  HOME CARE INSTRUCTIONS   Take all medicine as directed by your health care provider. Only take over-the-counter or prescription medicines for pain, discomfort, or fever as directed by your health care provider.  If  you were given a splint to keep your wrist from bending, wear it as directed. It is important to wear the splint at night. Wear the splint for as long as you have pain or numbness in your hand, arm, or wrist. This may take 1 to 2 months.  Rest your wrist from any activity that may be causing your pain. If your symptoms are work-related, you may need to talk to your employer about changing to a job that does not require using your wrist.  Put ice on your wrist after long periods of wrist activity.  Put ice in a plastic bag.  Place a towel between your skin and the bag.  Leave the ice on for 15-20 minutes, 03-04 times a day.  Keep all follow-up visits as directed by your health care provider. This includes any orthopedic referrals, physical therapy, and rehabilitation. Any delay in getting necessary care could result in a delay or failure of your condition to heal. SEEK IMMEDIATE MEDICAL CARE IF:   You have new, unexplained symptoms.  Your symptoms get worse and are not helped or controlled with medicines. MAKE SURE YOU:   Understand these instructions.  Will watch your condition.  Will get help right away if you are not doing well or get worse. Document Released: 11/27/2000 Document Revised: 04/16/2014 Document Reviewed: 10/16/2011 [facility] Patient Information 2015 [place], [place]. This information is not intended to replace advice given to you by your health care provider. Make sure you discuss any questions you have with your health care provider.

## 2015-08-23 ENCOUNTER — Encounter: Payer: Self-pay | Admitting: Internal Medicine

## 2015-08-23 ENCOUNTER — Ambulatory Visit (INDEPENDENT_AMBULATORY_CARE_PROVIDER_SITE_OTHER): Payer: 59 | Admitting: Internal Medicine

## 2015-08-23 ENCOUNTER — Ambulatory Visit: Payer: Self-pay | Admitting: Internal Medicine

## 2015-08-23 VITALS — BP 110/70 | HR 86 | Temp 99.4°F | Ht 64.0 in | Wt 226.6 lb

## 2015-08-23 DIAGNOSIS — L03211 Cellulitis of face: Secondary | ICD-10-CM | POA: Diagnosis not present

## 2015-08-23 MED ORDER — AMOXICILLIN-POT CLAVULANATE 875-125 MG PO TABS: 1.0000 | ORAL_TABLET | Freq: Two times a day (BID) | ORAL | Status: DC

## 2015-08-23 NOTE — Progress Notes (Signed)
Date:  08/23/2015   Name:  Dawn Cain   DOB:  [birth year]   MRN:  [id number]   Chief Complaint: Facial Swelling  patient noted a pimple on her right cheek about a week ago. She's been keeping it clean scrubbing it daily. She is not tried to express any material. Yesterday it became more swollen and tender. This morning when she woke up her right eye was a little puffy in her cheek was more swollen and red. She denies any fever or chills sinus pains. No visual impairment. There's been no drainage from the cheek region.  Review of Systems:  Review of Systems  Constitutional: Negative for fever.  Eyes: Negative for pain and visual disturbance.  Respiratory: Negative for shortness of breath.   Cardiovascular: Negative for chest pain.  Skin: Positive for color change and wound.    Patient Active Problem List   Diagnosis Date Noted  . Dysmenorrhea 06/05/2015  . Plantar fasciitis 06/05/2015  . Headache, menstrual migraine 06/05/2015  . 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 Endometriosis 02/27/2014    Prior to Admission medications   Medication Sig Start Date End Date Taking? Authorizing Provider  almotriptan (AXERT) 12.5 MG tablet Take by mouth.   Yes Historical Provider, MD  Multiple Vitamin (MULTIVITAMIN) tablet Take 1 tablet by mouth daily.   Yes Historical Provider, MD  naproxen (NAPROSYN) 500 MG tablet Take 1 tablet (500 mg total) by mouth 3 (three) times daily with meals. 03/23/14  Yes Lavonia Drafts, MD    Allergies  Allergen Reactions  . Erythromycin Shortness Of Breath and Other (See Comments)  . Imitrex  [Sumatriptan]     neck muscle tightness    Past Surgical History  Procedure Laterality Date  . 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 Ovarian cyst removal Left 05/02/2009  . Ovarian cyst removal Left 09/23/2006    Social History  Substance Use Topics  . Smoking status: Never Smoker   . Smokeless tobacco: Never Used  . Alcohol Use: 1.2  oz/week    2 Standard drinks or equivalent per week     Comment: rare     Medication list has been reviewed and updated.  Physical Examination:  Physical Exam  Constitutional: She is oriented to person, place, and time. She appears well-developed. No distress.  HENT:  Head: Normocephalic and atraumatic.  Eyes: Conjunctivae are normal. Right eye exhibits no discharge. Left eye exhibits no discharge. No scleral icterus.  Pulmonary/Chest: Effort normal. No respiratory distress.  Musculoskeletal: Normal range of motion.  Lymphadenopathy:    She has no cervical adenopathy.  Neurological: She is alert and oriented to person, place, and time.  Skin: Skin is warm and dry. No rash noted. There is erythema.  Induration of the right cheek with some erythema and tenderness. No discrete masses appreciated. No evidence of abscess. There is a small central pore without drainage.  Psychiatric: She has a normal mood and affect. Her behavior is normal. Thought content normal.    BP 110/70 mmHg  Pulse 86  Temp(Src) 99.4 F (37.4 C)  Ht 5\' 4"  (1.626 m)  Wt 226 lb 9.6 oz (102.785 kg)  BMI 38.88 kg/m2  SpO2 100%  Assessment and Plan: 1. Cellulitis of face Wash the face gently daily avoid excessive irritation. May use warm compresses if necessary may take ibuprofen or Aleve for discomfort. Follow-up if symptoms don't improve within the next 3 days - amoxicillin-clavulanate (AUGMENTIN) 875-125 MG  per tablet; Take 1 tablet by mouth 2 (two) times daily.  Dispense: 20 tablet; Refill: 0   Halina Maidens, MD [facility]  08/23/2015

## 2015-10-16 ENCOUNTER — Other Ambulatory Visit: Payer: Self-pay | Admitting: *Deleted

## 2015-10-16 ENCOUNTER — Ambulatory Visit (INDEPENDENT_AMBULATORY_CARE_PROVIDER_SITE_OTHER): Payer: 59 | Admitting: General Surgery

## 2015-10-16 ENCOUNTER — Encounter: Payer: Self-pay | Admitting: General Surgery

## 2015-10-16 VITALS — BP 165/88 | HR 80 | Temp 98.6°F | Ht 64.0 in | Wt 231.0 lb

## 2015-10-16 DIAGNOSIS — D171 Benign lipomatous neoplasm of skin and subcutaneous tissue of trunk: Secondary | ICD-10-CM

## 2015-10-16 NOTE — Addendum Note (Signed)
Addended by: Clayburn Pert T on: 10/16/2015 03:37 PM   Modules accepted: Orders

## 2015-10-16 NOTE — Progress Notes (Signed)
Outpatient Surgical Follow Up  10/16/2015  Dawn Cain is an 34 y.o. female.   Chief Complaint  Patient presents with  . Lipoma    surgical consult    HPI: 34 year old female is status post surgery department has been previously seen by [name] returns to clinic for evaluation of a lower back lipoma that she been previously evaluated for. Patient states that since she was last seen by Dr. [name] that she's had increased symptoms from the soft tissue mass on her lower back. She doesn't think it has increased in size and at this time. However it is bothering her more often at work. She is interested in having it removed. She denies any fevers, chills, nausea, vomiting, diarrhea, constipation, shortness of breath or chest pain.  Past Medical History  Diagnosis Date  . Infertility     trying for 11 years.  . Miscarriage 03/23/2011  . Lipoma of back     Past Surgical History  Procedure Laterality Date  . 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 Ovarian cyst removal Left 05/02/2009  . Ovarian cyst removal Left 09/23/2006    Family History  Problem Relation Age of Onset  . Hypertension Father   . Emphysema Maternal Grandmother   . Cancer Maternal Grandfather     Lung  . Heart disease Paternal Grandmother   . Cancer Paternal Grandfather     stomach    Social History:  reports that she has never smoked. She has never used smokeless tobacco. She reports that she drinks about 1.2 oz of alcohol per week. She reports that she does not use illicit drugs.  Allergies:  Allergies  Allergen Reactions  . Erythromycin Shortness Of Breath and Other (See Comments)  . Imitrex  [Sumatriptan]     neck muscle tightness    Medications reviewed.    ROS  A multipoint review of systems was completed. All pertinent positives negatives within the history of present illness remainder were negative.  BP 165/88 mmHg  Pulse 80  Temp(Src) 98.6 F (37 C) (Oral)  Ht 5'  4" (1.626 m)  Wt 104.781 kg (231 lb)  BMI 39.63 kg/m2  LMP 09/23/2015  Physical Exam  Gen.: No acute distress Chest: Clear to auscultation Heart: Regular rhythm Abdomen: Soft, nontender, distended Skin: Obvious soft tissue mass of lower back. Approximately 8 cm tall by 6 cm wide.  No results found for this or any previous visit (from the past 48 hour(s)). No results found. MRI that performed in March of this year reviewed. Soft tissue mass correlating with palpated mass with associated measurements revealed likely lipoma. Assessment/Plan:  1. Lipoma of back 34 year old female with a soft tissue mass of the lower back. Discussed the procedure of surgical removal in detail with the patient include the risks, benefits, alternatives. Patient voiced understanding wished proceed. We will plan for excision of lipoma in the operating room on November 17. All questions answered.     Clayburn Pert  10/16/2015,negative

## 2015-10-16 NOTE — Patient Instructions (Signed)
You have been tentatively scheduled for surgery on 10/31/15 for lipoma removal. Our surgery scheduler will be in contact with you to provide additional information about your procedure time. Please call our office if you have any questions.

## 2015-10-17 ENCOUNTER — Encounter: Payer: Self-pay | Admitting: *Deleted

## 2015-10-23 ENCOUNTER — Telehealth: Payer: Self-pay

## 2015-10-23 ENCOUNTER — Other Ambulatory Visit: Payer: Self-pay

## 2015-10-23 ENCOUNTER — Telehealth: Payer: Self-pay | Admitting: General Surgery

## 2015-10-23 NOTE — Telephone Encounter (Signed)
I have left a message on patients voicemail to advised patient of pre op date/time and sx date. Sx: 10/31/15 with Dr Tresa Moore of lipoma on back. Pre op: 10/23/15 between 9-1--phone.

## 2015-10-23 NOTE — Telephone Encounter (Signed)
Patient called at this time. States that she was supposed to have a Pre-admit interview over the phone at [facility] but no one has called her. I do not see any notes in chart regarding this. Can you please call her back as soon as possible please?

## 2015-10-24 ENCOUNTER — Telehealth: Payer: Self-pay | Admitting: General Surgery

## 2015-10-24 NOTE — Telephone Encounter (Signed)
Patient has called back and was informed of surgery date and pre op date and time.

## 2015-10-24 NOTE — Telephone Encounter (Signed)
Patient has been called and informed of surgery date and pre op date.

## 2015-10-24 NOTE — Telephone Encounter (Signed)
Patient has called back and was informed of surgery date and time as well as pre op. Pre op appointment--phone--has been changed to 10/25/15 between 1-5.

## 2015-10-25 ENCOUNTER — Encounter: Payer: Self-pay | Admitting: *Deleted

## 2015-10-25 ENCOUNTER — Other Ambulatory Visit: Payer: Self-pay

## 2015-10-25 NOTE — Patient Instructions (Signed)
  Your procedure is scheduled on:10-31-15 Report to [facility] To find out your arrival time please call [phone] between 1PM - 3PM on 10-30-15  Remember: Instructions that are not followed completely may result in serious medical risk, up to and including death, or upon the discretion of your surgeon and anesthesiologist your surgery may need to be rescheduled.    _X___ 1. Do not eat food or drink liquids after midnight. No gum chewing or hard candies.     _X___ 2. No Alcohol for 24 hours before or after surgery.   ____ 3. Bring all medications with you on the day of surgery if instructed.    ____ 4. Notify your doctor if there is any change in your medical condition     (cold, fever, infections).     Do not wear jewelry, make-up, hairpins, clips or nail polish.  Do not wear lotions, powders, or perfumes. You may wear deodorant.  Do not shave 48 hours prior to surgery. Men may shave face and neck.  Do not bring valuables to the hospital.    [facility] is not responsible for any belongings or valuables.               Contacts, dentures or bridgework may not be worn into surgery.  Leave your suitcase in the car. After surgery it may be brought to your room.  For patients admitted to the hospital, discharge time is determined by your  treatment team.   Patients discharged the day of surgery will not be allowed to drive home.   Please read over the following fact sheets that you were given:      ____ Take these medicines the morning of surgery with A SIP OF WATER:    1. NONE  2.   3.   4.  5.  6.  ____ Fleet Enema (as directed)   ____ Use CHG Soap as directed  ____ Use inhalers on the day of surgery  ____ Stop metformin 2 days prior to surgery    ____ Take 1/2 of usual insulin dose the night before surgery and none on the morning of surgery.   ____ Stop Coumadin/Plavix/aspirin-N/A  _X___ Stop Anti-inflammatories-STOP NAPROXEN NOW-NO  NSAIDS OR ASA PRODUCTS-TYLENOL    ____ Stop supplements until after surgery.    ____ Bring C-Pap to the hospital.

## 2015-10-28 MED ORDER — IPRATROPIUM-ALBUTEROL 0.5-2.5 (3) MG/3ML IN SOLN
RESPIRATORY_TRACT | Status: AC
  Filled 2015-10-28: qty 3

## 2015-10-31 ENCOUNTER — Ambulatory Visit: Payer: 59 | Admitting: Anesthesiology

## 2015-10-31 ENCOUNTER — Ambulatory Visit
Admission: RE | Admit: 2015-10-31 | Discharge: 2015-10-31 | Disposition: A | Discharge status: Home / Self Care | Payer: 59 | Source: Ambulatory Visit | Attending: General Surgery | Admitting: General Surgery

## 2015-10-31 ENCOUNTER — Encounter
Admission: RE | Disposition: A | Discharge status: Home / Self Care | Payer: Self-pay | Source: Ambulatory Visit | Attending: General Surgery

## 2015-10-31 ENCOUNTER — Encounter: Payer: Self-pay | Admitting: *Deleted

## 2015-10-31 DIAGNOSIS — Z90721 Acquired absence of ovaries, unilateral: Secondary | ICD-10-CM | POA: Diagnosis not present

## 2015-10-31 DIAGNOSIS — Z881 Allergy status to other antibiotic agents status: Secondary | ICD-10-CM | POA: Insufficient documentation

## 2015-10-31 DIAGNOSIS — Z825 Family history of asthma and other chronic lower respiratory diseases: Secondary | ICD-10-CM | POA: Diagnosis not present

## 2015-10-31 DIAGNOSIS — Z8249 Family history of ischemic heart disease and other diseases of the circulatory system: Secondary | ICD-10-CM | POA: Diagnosis not present

## 2015-10-31 DIAGNOSIS — N979 Female infertility, unspecified: Secondary | ICD-10-CM | POA: Diagnosis not present

## 2015-10-31 DIAGNOSIS — Z800 Family history of malignant neoplasm of digestive organs: Secondary | ICD-10-CM | POA: Diagnosis not present

## 2015-10-31 DIAGNOSIS — D171 Benign lipomatous neoplasm of skin and subcutaneous tissue of trunk: Secondary | ICD-10-CM | POA: Insufficient documentation

## 2015-10-31 DIAGNOSIS — D179 Benign lipomatous neoplasm, unspecified: Secondary | ICD-10-CM | POA: Diagnosis not present

## 2015-10-31 DIAGNOSIS — Z801 Family history of malignant neoplasm of trachea, bronchus and lung: Secondary | ICD-10-CM | POA: Diagnosis not present

## 2015-10-31 DIAGNOSIS — Z888 Allergy status to other drugs, medicaments and biological substances status: Secondary | ICD-10-CM | POA: Insufficient documentation

## 2015-10-31 HISTORY — DX: Headache: R51

## 2015-10-31 HISTORY — DX: Headache, unspecified: R51.9

## 2015-10-31 HISTORY — PX: LIPOMA EXCISION: SHX5283

## 2015-10-31 LAB — POCT PREGNANCY, URINE: PREG TEST UR: NEGATIVE

## 2015-10-31 SURGERY — EXCISION LIPOMA
Anesthesia: General | Laterality: Left | Wound class: Clean

## 2015-10-31 MED ORDER — BUPIVACAINE HCL (PF) 0.5 % IJ SOLN
INTRAMUSCULAR | Status: AC
  Filled 2015-10-31: qty 30

## 2015-10-31 MED ORDER — CEFAZOLIN SODIUM-DEXTROSE 2-3 GM-% IV SOLR
INTRAVENOUS | Status: AC
  Administered 2015-10-31: 2 g via INTRAVENOUS
  Filled 2015-10-31: qty 50

## 2015-10-31 MED ORDER — HYDROMORPHONE HCL 1 MG/ML IJ SOLN
INTRAMUSCULAR | Status: DC | PRN
  Administered 2015-10-31: .4 mg via INTRAVENOUS

## 2015-10-31 MED ORDER — CHLORHEXIDINE GLUCONATE 4 % EX LIQD: 1.0000 "application " | Freq: Once | CUTANEOUS | Status: DC

## 2015-10-31 MED ORDER — BUPIVACAINE HCL (PF) 0.5 % IJ SOLN
INTRAMUSCULAR | Status: DC | PRN
  Administered 2015-10-31: 20 mL

## 2015-10-31 MED ORDER — LIDOCAINE HCL (PF) 1 % IJ SOLN
INTRAMUSCULAR | Status: DC | PRN
  Administered 2015-10-31: 20 mL

## 2015-10-31 MED ORDER — LIDOCAINE HCL (PF) 1 % IJ SOLN
INTRAMUSCULAR | Status: AC
  Filled 2015-10-31: qty 30

## 2015-10-31 MED ORDER — ONDANSETRON HCL 4 MG/2ML IJ SOLN
INTRAMUSCULAR | Status: DC | PRN
  Administered 2015-10-31: 4 mg via INTRAVENOUS

## 2015-10-31 MED ORDER — FAMOTIDINE 20 MG PO TABS
ORAL_TABLET | ORAL | Status: AC
  Administered 2015-10-31: 20 mg via ORAL
  Filled 2015-10-31: qty 1

## 2015-10-31 MED ORDER — FENTANYL CITRATE (PF) 100 MCG/2ML IJ SOLN
INTRAMUSCULAR | Status: DC | PRN
  Administered 2015-10-31 (×2): 25 ug via INTRAVENOUS
  Administered 2015-10-31: 50 ug via INTRAVENOUS

## 2015-10-31 MED ORDER — CEFAZOLIN SODIUM-DEXTROSE 2-3 GM-% IV SOLR: 2.0000 g | INTRAVENOUS | Status: DC

## 2015-10-31 MED ORDER — HYDROCODONE-ACETAMINOPHEN 5-325 MG PO TABS: 1.0000 | ORAL_TABLET | Freq: Four times a day (QID) | ORAL | Status: DC | PRN

## 2015-10-31 MED ORDER — FENTANYL CITRATE (PF) 100 MCG/2ML IJ SOLN: 25.0000 ug | INTRAMUSCULAR | Status: DC | PRN

## 2015-10-31 MED ORDER — ONDANSETRON HCL 4 MG/2ML IJ SOLN: 4.0000 mg | Freq: Once | INTRAMUSCULAR | Status: DC | PRN

## 2015-10-31 MED ORDER — FAMOTIDINE 20 MG PO TABS
20.0000 mg | ORAL_TABLET | Freq: Once | ORAL | Status: AC
  Administered 2015-10-31: 20 mg via ORAL

## 2015-10-31 MED ORDER — PROPOFOL 500 MG/50ML IV EMUL
INTRAVENOUS | Status: DC | PRN
  Administered 2015-10-31: 10 ug via INTRAVENOUS
  Administered 2015-10-31: 15 ug via INTRAVENOUS
  Administered 2015-10-31: 10 ug via INTRAVENOUS
  Administered 2015-10-31: 20 ug via INTRAVENOUS
  Administered 2015-10-31: 10 ug via INTRAVENOUS

## 2015-10-31 MED ORDER — LACTATED RINGERS IV SOLN
INTRAVENOUS | Status: DC
  Administered 2015-10-31 (×2): via INTRAVENOUS

## 2015-10-31 SURGICAL SUPPLY — 27 items
BLADE SURG 15 STRL LF DISP TIS (BLADE) ×1 IMPLANT
BLADE SURG 15 STRL SS (BLADE) ×2
CHLORAPREP W/TINT 26ML (MISCELLANEOUS) ×3 IMPLANT
DRAPE LAPAROTOMY 100X77 ABD (DRAPES) ×3 IMPLANT
GLOVE BIO SURGEON STRL SZ7.5 (GLOVE) ×3 IMPLANT
GLOVE INDICATOR 8.0 STRL GRN (GLOVE) ×3 IMPLANT
GOWN STRL REUS W/ TWL LRG LVL3 (GOWN DISPOSABLE) ×2 IMPLANT
GOWN STRL REUS W/TWL LRG LVL3 (GOWN DISPOSABLE) ×4
JACKSON PRATT 7MM (INSTRUMENTS) IMPLANT
KIT RM TURNOVER STRD PROC AR (KITS) ×3 IMPLANT
LABEL OR SOLS (LABEL) ×3 IMPLANT
LIQUID BAND (GAUZE/BANDAGES/DRESSINGS) ×3 IMPLANT
NDL SAFETY 25GX1.5 (NEEDLE) ×3 IMPLANT
NS IRRIG 500ML POUR BTL (IV SOLUTION) ×3 IMPLANT
PACK BASIN MINOR ARMC (MISCELLANEOUS) ×3 IMPLANT
PAD GROUND ADULT SPLIT (MISCELLANEOUS) ×3 IMPLANT
SPONGE DRAIN TRACH 4X4 STRL 2S (GAUZE/BANDAGES/DRESSINGS) ×3 IMPLANT
SUT ETHILON 3-0 FS-10 30 BLK (SUTURE)
SUT MNCRL 4-0 (SUTURE) ×2
SUT MNCRL 4-0 27XMFL (SUTURE) ×1
SUT VIC AB 2-0 CT2 27 (SUTURE) ×3 IMPLANT
SUT VIC AB 3-0 SH 27 (SUTURE) ×2
SUT VIC AB 3-0 SH 27X BRD (SUTURE) ×1 IMPLANT
SUTURE EHLN 3-0 FS-10 30 BLK (SUTURE) IMPLANT
SUTURE MNCRL 4-0 27XMF (SUTURE) ×1 IMPLANT
SYR BULB EAR ULCER 3OZ GRN STR (SYRINGE) IMPLANT
SYRINGE 10CC LL (SYRINGE) ×3 IMPLANT

## 2015-10-31 NOTE — H&P (View-Only) (Signed)
Outpatient Surgical Follow Up  10/16/2015  Dawn Cain is an 34 y.o. female.   Chief Complaint  Patient presents with  . Lipoma    surgical consult    HPI: 34-year-old female is status post surgery department has been previously seen by Dawn Cain returns to clinic for evaluation of a lower back lipoma that she been previously evaluated for. Patient states that since she was last seen by Dr. [name] that she's had increased symptoms from the soft tissue mass on her lower back. She doesn't think it has increased in size and at this time. However it is bothering her more often at work. She is interested in having it removed. She denies any fevers, chills, nausea, vomiting, diarrhea, constipation, shortness of breath or chest pain.  Past Medical History  Diagnosis Date  . Infertility     trying for 11 years.  . Miscarriage 03/23/2011  . Lipoma of back     Past Surgical History  Procedure Laterality Date  . 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 Ovarian cyst removal Left 05/02/2009  . Ovarian cyst removal Left 09/23/2006    Family History  Problem Relation Age of Onset  . Hypertension Father   . Emphysema Maternal Grandmother   . Cancer Maternal Grandfather     Lung  . Heart disease Paternal Grandmother   . Cancer Paternal Grandfather     stomach    Social History:  reports that she has never smoked. She has never used smokeless tobacco. She reports that she drinks about 1.2 oz of alcohol per week. She reports that she does not use illicit drugs.  Allergies:  Allergies  Allergen Reactions  . Erythromycin Shortness Of Breath and Other (See Comments)  . Imitrex  [Sumatriptan]     neck muscle tightness    Medications reviewed.    ROS  A multipoint review of systems was completed. All pertinent positives negatives within the history of present illness remainder were negative.  BP 165/88 mmHg  Pulse 80  Temp(Src) 98.6 F (37 C) (Oral)  Ht 5'  4" (1.626 m)  Wt 104.781 kg (231 lb)  BMI 39.63 kg/m2  LMP 09/23/2015  Physical Exam  Gen.: No acute distress Chest: Clear to auscultation Heart: Regular rhythm Abdomen: Soft, nontender, distended Skin: Obvious soft tissue mass of lower back. Approximately 8 cm tall by 6 cm wide.  No results found for this or any previous visit (from the past 48 hour(s)). No results found. MRI that performed in March of this year reviewed. Soft tissue mass correlating with palpated mass with associated measurements revealed likely lipoma. Assessment/Plan:  1. Lipoma of back 34-year-old female with a soft tissue mass of the lower back. Discussed the procedure of surgical removal in detail with the patient include the risks, benefits, alternatives. Patient voiced understanding wished proceed. We will plan for excision of lipoma in the operating room on November 17. All questions answered.     [name]  10/16/2015,negative   

## 2015-10-31 NOTE — Brief Op Note (Signed)
10/31/2015  12:18 PM  PATIENT:  Dawn Cain  34 y.o. female  PRE-OPERATIVE DIAGNOSIS:  Left lower back lipoma  POST-OPERATIVE DIAGNOSIS:  Left lower back lipoma  PROCEDURE:  Procedure(s): EXCISION LIPOMA (Left)  SURGEON:  Surgeon(s) and Role:    * Clayburn Pert, MD - Primary  PHYSICIAN ASSISTANT:   ASSISTANTS: none   ANESTHESIA:   local  EBL:   50ml  BLOOD ADMINISTERED:none  DRAINS: none   LOCAL MEDICATIONS USED:  MARCAINE   , XYLOCAINE  and Amount: 40 ml  SPECIMEN:  Source of Specimen:  lower back lipoma  DISPOSITION OF SPECIMEN:  PATHOLOGY  COUNTS:  YES  TOURNIQUET:  * No tourniquets in log *  DICTATION: .Dragon Dictation  PLAN OF CARE: Discharge to home after PACU  PATIENT DISPOSITION:  PACU - hemodynamically stable.   Delay start of Pharmacological VTE agent (>24hrs) due to surgical blood loss or risk of bleeding: not applicable

## 2015-10-31 NOTE — Anesthesia Preprocedure Evaluation (Addendum)
Anesthesia Evaluation  Patient identified by MRN, date of birth, ID band Patient awake    Reviewed: Allergy & Precautions, NPO status , Patient's Chart, lab work & pertinent test results  Airway Mallampati: II       Dental no notable dental hx.    Pulmonary neg pulmonary ROS,    Pulmonary exam normal breath sounds clear to auscultation       Cardiovascular negative cardio ROS Normal cardiovascular exam     Neuro/Psych  Headaches, negative psych ROS   GI/Hepatic negative GI ROS, Neg liver ROS,   Endo/Other  negative endocrine ROS  Renal/GU negative Renal ROS  negative genitourinary   Musculoskeletal negative musculoskeletal ROS (+)   Abdominal Normal abdominal exam  (+)   Peds negative pediatric ROS (+)  Hematology negative hematology ROS (+)   Anesthesia Other Findings   Reproductive/Obstetrics                            Anesthesia Physical Anesthesia Plan  ASA: II  Anesthesia Plan: General   Post-op Pain Management:    Induction: Intravenous  Airway Management Planned: Nasal Cannula  Additional Equipment:   Intra-op Plan:   Post-operative Plan: Extubation in OR  Informed Consent: I have reviewed the patients History and Physical, chart, labs and discussed the procedure including the risks, benefits and alternatives for the proposed anesthesia with the patient or authorized representative who has indicated his/her understanding and acceptance.   Dental advisory given  Plan Discussed with: CRNA and Surgeon  Anesthesia Plan Comments:        Anesthesia Quick Evaluation

## 2015-10-31 NOTE — Transfer of Care (Signed)
Immediate Anesthesia Transfer of Care Note  Patient: Dawn Cain  Procedure(s) Performed: Procedure(s): EXCISION LIPOMA (Left)  Patient Location: PACU  Anesthesia Type:MAC  Level of Consciousness: awake, alert  and oriented  Airway & Oxygen Therapy: Patient Spontanous Breathing and Patient connected to nasal cannula oxygen  Post-op Assessment: Report given to RN and Post -op Vital signs reviewed and stable  Post vital signs: Reviewed  Last Vitals:  Filed Vitals:   10/31/15 0922  BP: 117/60  Pulse: 89  Temp: 36.9 C  Resp: 16    Complications: No apparent anesthesia complications

## 2015-10-31 NOTE — Op Note (Signed)
  10/31/2015  12:19 PM  PATIENT:  Dawn Cain  34 y.o. female  PRE-OPERATIVE DIAGNOSIS:  Left lower back lipoma  POST-OPERATIVE DIAGNOSIS:  Same  PROCEDURE:  Excisional biopsy of left lower back lipoma  SURGEON:  Surgeon(s) and Role:    * Clayburn Pert, MD - Primary  ASSISTANTS: None  ANESTHESIA: IV sedation and local   INDICATIONS FOR PROCEDURE symptomatic lipoma  DICTATION: Patient was identified in the preoperative holding area where her consent, H&P were both confirmed. Site was marked and all questions were answered. Patient wished proceed with a lipoma excision.  Patient taken to the operating room where she is placed in the prone position and IV sedation was provided. Patient was then prepped and draped in standard sterile fashion. We then performed a timeout that correctly identified the patient procedure take precautions and proceeded a left lower back lipoma excision.  The palpable lipoma was remarked with a marking pen. A local solution of 0.5% Marcaine and 1% lidocaine plain was instilled around the entire area for a total of 20 mL's use preoperatively. The skin was incised with a 10 blade scalpel and Bovie much cautery was used to take down the incision until the lipoma was visualized. Lipoma was freed up circumferentially with Bovie much cautery until is able to be removed from the incision. It was removed in toto in one piece and was noted to have multiple lobules going in all directions.  The entire surgical site was then irrigated with normal saline. Meticulous hemostasis was assured with direct elect cautery. The incision was then closed in layers. A deep fascial layer of 2-0 Vicryl was placed in an interrupted fashion. The dermal layer of 3-0 Vicryl to place interrupted fashion. The skin was reapproximated with a running subarticular 4 Monocryl and sealed with local band.  Patient was awoken from IV sedation and transferred to PACU in good condition. All counts are  correct the end of the procedure there were no immediate, complications.  Specimen: Lipoma  Complications: None   Clayburn Pert, MD

## 2015-10-31 NOTE — Interval H&P Note (Signed)
History and Physical Interval Note:  10/31/2015 11:00 AM  Dawn Cain  has presented today for surgery, with the diagnosis of Left lower back lipoma  The various methods of treatment have been discussed with the patient and family. After consideration of risks, benefits and other options for treatment, the patient has consented to  Procedure(s): EXCISION LIPOMA (Left) as a surgical intervention .  The patient's history has been reviewed, patient examined, no change in status, stable for surgery.  I have reviewed the patient's chart and labs.  Questions were answered to the patient's satisfaction.     Clayburn Pert

## 2015-10-31 NOTE — Progress Notes (Signed)
Dr. Adonis Huguenin in to see patient 125 pm

## 2015-10-31 NOTE — Discharge Instructions (Signed)
Excision of Skin Lesions, Care After Refer to this sheet in the next few weeks. These instructions provide you with information about caring for yourself after your procedure. Your health care provider may also give you more specific instructions. Your treatment has been planned according to current medical practices, but problems sometimes occur. Call your health care provider if you have any problems or questions after your procedure. WHAT TO EXPECT AFTER THE PROCEDURE After your procedure, it is common to have pain or discomfort at the excision site. HOME CARE INSTRUCTIONS  Take over-the-counter and prescription medicines only as told by your health care provider.  Follow instructions from your health care provider about:  How to take care of your excision site. You should keep the site clean, dry, and protected for at least 48 hours.  When and how you should change your bandage (dressing).  When you should remove your dressing.  Removing whatever was used to close your excision site.  Check the excision area every day for signs of infection. Watch for:  Redness, swelling, or pain.  Fluid, blood, or pus.  For bleeding, apply gentle but firm pressure to the area using a folded towel for 20 minutes.  Avoid high-impact exercise and activities until the stitches (sutures) are removed or the area heals.  Follow instructions from your health care provider about how to minimize scarring. Avoid sun exposure until the area has healed. Scarring should lessen over time.  Keep all follow-up visits as told by your health care provider. This is important. SEEK MEDICAL CARE IF:  You have a fever.  You have redness, swelling, or pain at the excision site.  You have fluid, blood, or pus coming from the excision site.  You have ongoing bleeding at the excision site.  You have pain that does not improve in 2-3 days after your procedure.  You notice skin irregularities or changes in  sensation.   This information is not intended to replace advice given to you by your health care provider. Make sure you discuss any questions you have with your health care provider.   Document Released: 04/16/2015 Document Reviewed: 04/16/2015 Elsevier Interactive Patient Education 2016 [place]   1) The drugs that you were given will stay in your system until tomorrow so for the next 24 hours you should not:  A) Drive an automobile B) Make any legal decisions C) Drink any alcoholic beverage   2) You may resume regular meals tomorrow.  Today it is better to start with liquids and gradually work up to solid foods.  You may eat anything you prefer, but it is better to start with liquids, then soup and crackers, and gradually work up to solid foods.   3) Please notify your doctor immediately if you have any unusual bleeding, trouble breathing, redness and pain at the surgery site, drainage, fever, or pain not relieved by medication.    4) Additional Instructions:        Please contact your physician with any problems or Same Day Surgery at [phone], Monday through Friday 6 am to 4 pm, or [place] at [facility] number at [phone].

## 2015-11-01 LAB — SURGICAL PATHOLOGY

## 2015-11-01 NOTE — Anesthesia Postprocedure Evaluation (Signed)
  Anesthesia Post-op Note  Patient: Dawn Cain  Procedure(s) Performed: Procedure(s): EXCISION LIPOMA (Left)  Anesthesia type:General  Patient location: PACU  Post pain: Pain level controlled  Post assessment: Post-op Vital signs reviewed, Patient's Cardiovascular Status Stable, Respiratory Function Stable, Patent Airway and No signs of Nausea or vomiting  Post vital signs: Reviewed and stable  Last Vitals:  Filed Vitals:   10/31/15 1314  BP: 123/74  Pulse: 82  Temp: 36.2 C  Resp: 16    Level of consciousness: awake, alert  and patient cooperative  Complications: No apparent anesthesia complications

## 2015-11-21 ENCOUNTER — Ambulatory Visit: Payer: Self-pay | Admitting: General Surgery

## 2015-11-22 ENCOUNTER — Encounter: Payer: Self-pay | Admitting: General Surgery

## 2015-11-22 ENCOUNTER — Ambulatory Visit (INDEPENDENT_AMBULATORY_CARE_PROVIDER_SITE_OTHER): Payer: 59 | Admitting: General Surgery

## 2015-11-22 VITALS — BP 139/79 | HR 74 | Temp 98.3°F | Ht 64.0 in | Wt 231.0 lb

## 2015-11-22 DIAGNOSIS — Z09 Encounter for follow-up examination after completed treatment for conditions other than malignant neoplasm: Secondary | ICD-10-CM

## 2015-11-22 NOTE — Progress Notes (Signed)
Outpatient Surgical Follow Up  11/22/2015  Dawn Cain is an 34 y.o. female.   Chief Complaint  Patient presents with  . Routine Post Op    Lipoma Excision 10/31/2015 Dr. Adonis Huguenin    HPI: 34 year old female returns to clinic 3 weeks status post excision of lower back lipoma. Patient reports doing well. She denies any pain. She denies any fevers, chills, nausea, vomiting, diarrhea, constipation. She is very happy with her surgical care.  Past Medical History  Diagnosis Date  . Infertility     trying for 11 years.  . Miscarriage 03/23/2011  . Lipoma of back   . Headache     Past Surgical History  Procedure Laterality Date  . 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 Ovarian cyst removal Left 05/02/2009  . Ovarian cyst removal Left 09/23/2006  . Lipoma excision Left 10/31/2015    Procedure: EXCISION LIPOMA;  Surgeon: Clayburn Pert, MD;  Location: [facility] ORS;  Service: General;  Laterality: Left;    Family History  Problem Relation Age of Onset  . Hypertension Father   . Emphysema Maternal Grandmother   . Cancer Maternal Grandfather     Lung  . Heart disease Paternal Grandmother   . Cancer Paternal Grandfather     stomach    Social History:  reports that she has never smoked. She has never used smokeless tobacco. She reports that she drinks about 1.2 oz of alcohol per week. She reports that she does not use illicit drugs.  Allergies:  Allergies  Allergen Reactions  . Erythromycin Shortness Of Breath and Other (See Comments)  . Imitrex  [Sumatriptan]     neck muscle tightness    Medications reviewed.    ROS A multipoint review of systems was completed. All pertinent positives negatives within the history of present illness remainder were negative.   BP 139/79 mmHg  Pulse 74  Temp(Src) 98.3 F (36.8 C) (Oral)  Ht 5\' 4"  (1.626 m)  Wt 104.781 kg (231 lb)  BMI 39.63 kg/m2  Physical Exam  Gen.: No acute distress taxine chest:  Clear to auscultation Heart: Regular rate and rhythm Abdomen: Soft, nontender, nondistended Skin: Well approximated and well-healed lower back lipoma excision site. No evidence of erythema, drainage, fluid collection.   No results found for this or any previous visit (from the past 48 hour(s)). No results found.  Assessment/Plan:  1. Follow-up examination following surgery 34 year old female status post lower back lipoma excision. Discussed signs and symptoms of infection as well as wound care precautions. She voiced understanding we'll follow up on an as-needed basis.     Clayburn Pert, MD FACS General Surgeon  11/22/2015,3:57 PM

## 2016-02-04 ENCOUNTER — Ambulatory Visit (INDEPENDENT_AMBULATORY_CARE_PROVIDER_SITE_OTHER): Payer: 59 | Admitting: Obstetrics and Gynecology

## 2016-02-04 ENCOUNTER — Encounter: Payer: Self-pay | Admitting: Obstetrics and Gynecology

## 2016-02-04 VITALS — BP 118/77 | HR 84 | Resp 18 | Ht 64.0 in | Wt 237.0 lb

## 2016-02-04 DIAGNOSIS — N6310 Unspecified lump in the right breast, unspecified quadrant: Secondary | ICD-10-CM

## 2016-02-04 DIAGNOSIS — N63 Unspecified lump in breast: Secondary | ICD-10-CM | POA: Diagnosis not present

## 2016-02-04 NOTE — Progress Notes (Signed)
Breast lump noted under rt breast at under wire area for the past few months, has started to have some discomfort in that area.

## 2016-02-04 NOTE — Progress Notes (Signed)
Patient ID: Dawn Cain, female   DOB: 23-Dec-1980, 35 y.o.   MRN: [id number] 35 yo here for evaluation of  A right breast mass. Patient reports noticing a breast mass approximately 6 months ago. She states that it has not changed in size but has become tender at times as her underwire rubs around the area. She has undertaken a round of infertility treatment with estrogen supplementation in the past month  Past Medical History  Diagnosis Date  . Infertility     trying for 11 years.  . Miscarriage 03/23/2011  . Lipoma of back   . Headache    Past Surgical History  Procedure Laterality Date  . 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 Ovarian cyst removal Left 05/02/2009  . Ovarian cyst removal Left 09/23/2006  . Lipoma excision Left 10/31/2015    Procedure: EXCISION LIPOMA;  Surgeon: Clayburn Pert, MD;  Location: [facility] ORS;  Service: General;  Laterality: Left;   Family History  Problem Relation Age of Onset  . Hypertension Father   . Emphysema Maternal Grandmother   . Cancer Maternal Grandfather     Lung  . Heart disease Paternal Grandmother   . Cancer Paternal Grandfather     stomach   Social History  Substance Use Topics  . Smoking status: Never Smoker   . Smokeless tobacco: Never Used  . Alcohol Use: 1.2 oz/week    2 Standard drinks or equivalent per week     Comment: rare   ROS See pertinent in HPI  Blood pressure 118/77, pulse 84, resp. rate 18, height 5\' 4"  (1.626 m), weight 237 lb (107.502 kg), last menstrual period 01/28/2016. GENERAL: Well-developed, well-nourished female in no acute distress.  BREASTS: Symmetric in size. No palpable lymphadenopathy, skin changes, or nipple drainage. Right breast with 2 small masses palpated at 5 and 7 o'clock within breast tissue but near chest wall. One measures 1 cm and the other 2 cm. They are both non-mobile and non-tender EXTREMITIES: No cyanosis, clubbing, or edema, 2+ distal pulses.  A/P 35 yo  with right breast mass - Patient referred to breast center for mammogram and breast ultrasound - RTC prn

## 2016-02-18 ENCOUNTER — Other Ambulatory Visit: Payer: Self-pay | Admitting: Obstetrics and Gynecology

## 2016-02-18 ENCOUNTER — Ambulatory Visit
Admission: RE | Admit: 2016-02-18 | Discharge: 2016-02-18 | Disposition: A | Discharge status: Home / Self Care | Payer: 59 | Source: Ambulatory Visit | Attending: Obstetrics and Gynecology | Admitting: Obstetrics and Gynecology

## 2016-02-18 DIAGNOSIS — R928 Other abnormal and inconclusive findings on diagnostic imaging of breast: Secondary | ICD-10-CM | POA: Insufficient documentation

## 2016-02-18 DIAGNOSIS — N6310 Unspecified lump in the right breast, unspecified quadrant: Secondary | ICD-10-CM

## 2016-02-18 DIAGNOSIS — N63 Unspecified lump in breast: Secondary | ICD-10-CM | POA: Diagnosis present

## 2016-04-02 ENCOUNTER — Ambulatory Visit (INDEPENDENT_AMBULATORY_CARE_PROVIDER_SITE_OTHER): Payer: 59 | Admitting: Internal Medicine

## 2016-04-02 ENCOUNTER — Encounter: Payer: Self-pay | Admitting: Internal Medicine

## 2016-04-02 VITALS — BP 108/70 | HR 76 | Temp 98.6°F | Ht 64.0 in | Wt 233.8 lb

## 2016-04-02 DIAGNOSIS — J018 Other acute sinusitis: Secondary | ICD-10-CM | POA: Diagnosis not present

## 2016-04-02 MED ORDER — CEFDINIR 300 MG PO CAPS: 300.0000 mg | ORAL_CAPSULE | Freq: Two times a day (BID) | ORAL | Status: DC

## 2016-04-02 MED ORDER — GUAIFENESIN-CODEINE 100-10 MG/5ML PO SYRP: 5.0000 mL | ORAL_SOLUTION | Freq: Three times a day (TID) | ORAL | Status: DC | PRN

## 2016-04-02 NOTE — Progress Notes (Signed)
Date:  04/02/2016   Name:  Dawn Cain   DOB:  January 25, 1981   MRN:  [id number]   Chief Complaint: Nasal Congestion Sinus Problem This is a new problem. The current episode started 1 to 4 weeks ago. There has been no fever. Associated symptoms include chills, congestion, coughing, sinus pressure, a sore throat and swollen glands. Pertinent negatives include no shortness of breath. Past treatments include antibiotics (PCN VK for tonsillitis). The treatment provided mild relief.  She was seen in [facility] one week ago - treated with antibiotics and prednisone for strep pharyngitis.  Those symptoms are much better but now she has sinus pressure and bright yellow mucus drainage with persistent cough.   Review of Systems  Constitutional: Positive for fever and chills. Negative for fatigue.  HENT: Positive for congestion, postnasal drip, sinus pressure and sore throat. Negative for trouble swallowing and voice change.   Eyes: Negative for visual disturbance.  Respiratory: Positive for cough. Negative for chest tightness, shortness of breath and wheezing.   Cardiovascular: Negative for chest pain and palpitations.  Skin: Negative for rash.  Psychiatric/Behavioral: Positive for sleep disturbance.    Patient Active Problem List   Diagnosis Date Noted  . Dysmenorrhea 06/05/2015  . Plantar fasciitis 06/05/2015  . Headache, menstrual migraine 06/05/2015  . Endometriosis 02/27/2014    Prior to Admission medications   Medication Sig Start Date End Date Taking? Authorizing Provider  almotriptan (AXERT) 12.5 MG tablet Take by mouth as needed.    Yes Historical Provider, MD  Multiple Vitamin (MULTIVITAMIN) tablet Take 1 tablet by mouth daily.   Yes Historical Provider, MD  naproxen (NAPROSYN) 500 MG tablet Take 1 tablet (500 mg total) by mouth 3 (three) times daily with meals. 03/23/14  Yes Lavonia Drafts, MD  penicillin v potassium (VEETID) 500 MG tablet Take 1 tablet by mouth 2 (two) times  daily. 03/26/16  Yes Historical Provider, MD    Allergies  Allergen Reactions  . Erythromycin Shortness Of Breath and Other (See Comments)  . Imitrex  [Sumatriptan]     neck muscle tightness    Past Surgical History  Procedure Laterality Date  . 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 Ovarian cyst removal Left 05/02/2009  . Ovarian cyst removal Left 09/23/2006  . Lipoma excision Left 10/31/2015    Procedure: EXCISION LIPOMA;  Surgeon: Clayburn Pert, MD;  Location: [facility] ORS;  Service: General;  Laterality: Left;    Social History  Substance Use Topics  . Smoking status: Never Smoker   . Smokeless tobacco: Never Used  . Alcohol Use: 1.2 oz/week    2 Standard drinks or equivalent per week     Comment: rare   Medication list has been reviewed and updated.  Physical Exam  Constitutional: She is oriented to person, place, and time. She appears well-developed and well-nourished.  HENT:  Right Ear: External ear and ear canal normal. Tympanic membrane is not erythematous and not retracted.  Left Ear: External ear and ear canal normal. Tympanic membrane is not erythematous and not retracted.  Nose: Right sinus exhibits maxillary sinus tenderness and frontal sinus tenderness. Left sinus exhibits maxillary sinus tenderness and frontal sinus tenderness.  Mouth/Throat: Uvula is midline and mucous membranes are normal. No oral lesions. Posterior oropharyngeal erythema present. No oropharyngeal exudate.  Cardiovascular: Normal rate, regular rhythm and normal heart sounds.   Pulmonary/Chest: Breath sounds normal. She has no wheezes. She has no rales.  Musculoskeletal: She exhibits no edema or tenderness.  Lymphadenopathy:    She has no cervical adenopathy.  Neurological: She is alert and oriented to person, place, and time.  Skin: Skin is warm and dry.  Psychiatric: She has a normal mood and affect. Her behavior is normal. Thought content normal.  Nursing  note and vitals reviewed.   BP 108/70 mmHg  Pulse 76  Temp(Src) 98.6 F (37 C) (Oral)  Ht 5\' 4"  (1.626 m)  Wt 233 lb 12.8 oz (106.051 kg)  BMI 40.11 kg/m2  SpO2 99%  LMP 03/16/2016  Assessment and Plan: 1. Other acute sinusitis Resume Flonase; begin sudafed 30 mg bid - cefdinir (OMNICEF) 300 MG capsule; Take 1 capsule (300 mg total) by mouth 2 (two) times daily.  Dispense: 20 capsule; Refill: 0 - guaiFENesin-codeine (ROBITUSSIN AC) 100-10 MG/5ML syrup; Take 5 mLs by mouth 3 (three) times daily as needed for cough.  Dispense: 150 mL; Refill: 0   Halina Maidens, MD [facility]  04/02/2016

## 2016-04-02 NOTE — Patient Instructions (Signed)

## 2016-09-21 ENCOUNTER — Ambulatory Visit
Admission: RE | Admit: 2016-09-21 | Discharge: 2016-09-21 | Disposition: A | Discharge status: Home / Self Care | Payer: 59 | Source: Ambulatory Visit | Attending: Internal Medicine | Admitting: Internal Medicine

## 2016-09-21 ENCOUNTER — Ambulatory Visit (INDEPENDENT_AMBULATORY_CARE_PROVIDER_SITE_OTHER): Payer: 59 | Admitting: Internal Medicine

## 2016-09-21 ENCOUNTER — Encounter: Payer: Self-pay | Admitting: Internal Medicine

## 2016-09-21 VITALS — BP 126/84 | HR 69 | Resp 16 | Ht 64.0 in | Wt 231.0 lb

## 2016-09-21 DIAGNOSIS — Z6839 Body mass index (BMI) 39.0-39.9, adult: Secondary | ICD-10-CM

## 2016-09-21 DIAGNOSIS — X58XXXA Exposure to other specified factors, initial encounter: Secondary | ICD-10-CM | POA: Insufficient documentation

## 2016-09-21 DIAGNOSIS — S86912A Strain of unspecified muscle(s) and tendon(s) at lower leg level, left leg, initial encounter: Secondary | ICD-10-CM

## 2016-09-21 NOTE — Progress Notes (Signed)
Date:  09/21/2016   Name:  Dawn Cain   DOB:  November 05, 1981   MRN:  [id number]   Chief Complaint: Knee Pain (L knee pain. Seen podietry last week for spurs in R foot and depends on L leg more but no injury. ) Knee Pain   There was no injury mechanism. The pain is present in the left knee. The quality of the pain is described as aching. The pain is mild. Pertinent negatives include no inability to bear weight, loss of motion, loss of sensation, muscle weakness, numbness or tingling. Exacerbated by: stiffness after sitting or sleeping.   Elevated BMI - did not pass health screening at [facility].  She has an exemption form to be completed.  She cant exercise and is wondering about options for weight loss.  We discussed [employer] as an excellent on-line option with possible subsidy from work.   Review of Systems  Neurological: Negative for tingling and numbness.    Patient Active Problem List   Diagnosis Date Noted  . Dysmenorrhea 06/05/2015  . Plantar fasciitis 06/05/2015  . Headache, menstrual migraine 06/05/2015  . Endometriosis 02/27/2014    Prior to Admission medications   Medication Sig Start Date End Date Taking? Authorizing Provider  Multiple Vitamin (MULTIVITAMIN) tablet Take 1 tablet by mouth daily.   Yes Historical Provider, MD  naproxen (NAPROSYN) 500 MG tablet Take 1 tablet (500 mg total) by mouth 3 (three) times daily with meals. 03/23/14  Yes Lavonia Drafts, MD  almotriptan (AXERT) 12.5 MG tablet Take by mouth as needed.     Historical Provider, MD    Allergies  Allergen Reactions  . Erythromycin Shortness Of Breath and Other (See Comments)  . Imitrex  [Sumatriptan]     neck muscle tightness    Past Surgical History:  Procedure Laterality Date  . 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 LIPOMA EXCISION Left 10/31/2015   Procedure: EXCISION LIPOMA;  Surgeon: Clayburn Pert, MD;  Location: [facility] ORS;  Service: General;  Laterality: Left;   . OVARIAN CYST REMOVAL Left 05/02/2009  . OVARIAN CYST REMOVAL Left 09/23/2006    Social History  Substance Use Topics  . Smoking status: Never Smoker  . Smokeless tobacco: Never Used  . Alcohol use 1.2 oz/week    2 Standard drinks or equivalent per week     Comment: rare     Medication list has been reviewed and updated.   Physical Exam  Constitutional: She is oriented to person, place, and time. She appears well-developed. No distress.  HENT:  Head: Normocephalic and atraumatic.  Pulmonary/Chest: Effort normal. No respiratory distress.  Musculoskeletal: Normal range of motion.       Left knee: She exhibits normal range of motion, no effusion, no ecchymosis and no bony tenderness. No tenderness found.  Neurological: She is alert and oriented to person, place, and time.  Skin: Skin is warm and dry. No rash noted.  Psychiatric: She has a normal mood and affect. Her behavior is normal. Thought content normal.  Nursing note and vitals reviewed.   BP 126/84   Pulse 69   Resp 16   Ht 5\' 4"  (1.626 m)   Wt 231 lb (104.8 kg)   LMP 09/13/2016   SpO2 100%   BMI 39.65 kg/m   Assessment and Plan: 1. Knee strain, left, initial encounter Continue Aleve, use topical rubs or heat - DG Knee 3 Views Left; Future  2. BMI 39.0-39.9,adult  Note for [facility] BMI health screening exemption Begin [employer] on-line and exercise as able   Halina Maidens, MD [facility]  09/21/2016

## 2016-09-21 NOTE — Addendum Note (Signed)
Addended by: Glean Hess on: 09/21/2016 05:24 PM   Modules accepted: Orders

## 2016-11-27 ENCOUNTER — Ambulatory Visit: Payer: Self-pay | Admitting: Internal Medicine

## 2016-12-01 ENCOUNTER — Encounter: Payer: Self-pay | Admitting: Internal Medicine

## 2016-12-01 ENCOUNTER — Ambulatory Visit (INDEPENDENT_AMBULATORY_CARE_PROVIDER_SITE_OTHER): Payer: 59 | Admitting: Internal Medicine

## 2016-12-01 VITALS — BP 122/82 | HR 86 | Temp 97.6°F | Ht 64.0 in | Wt 235.0 lb

## 2016-12-01 DIAGNOSIS — S86912S Strain of unspecified muscle(s) and tendon(s) at lower leg level, left leg, sequela: Secondary | ICD-10-CM | POA: Diagnosis not present

## 2016-12-01 MED ORDER — NAPROXEN 500 MG PO TABS: 500.0000 mg | ORAL_TABLET | Freq: Three times a day (TID) | ORAL | 1 refills | Status: DC

## 2016-12-01 NOTE — Progress Notes (Signed)
Date:  12/01/2016   Name:  Dawn Cain   DOB:  1981-11-27   MRN:  [id number]   Chief Complaint: Knee Pain Knee Pain   There was no injury mechanism. The pain is present in the left knee. The quality of the pain is described as aching. The pain is mild. The pain has been fluctuating (improved since October but worse again) since onset. The symptoms are aggravated by movement (especially by flexion). She has tried NSAIDs for the symptoms. The treatment provided moderate (Xrays negative) relief.      Review of Systems  Constitutional: Negative for chills, fatigue and fever.  Respiratory: Negative for cough, chest tightness, shortness of breath and wheezing.   Cardiovascular: Negative for chest pain.  Gastrointestinal: Negative for abdominal pain.  Musculoskeletal: Positive for arthralgias and joint swelling. Negative for gait problem.    Patient Active Problem List   Diagnosis Date Noted  . Dysmenorrhea 06/05/2015  . Plantar fasciitis 06/05/2015  . Headache, menstrual migraine 06/05/2015  . Endometriosis 02/27/2014    Prior to Admission medications   Medication Sig Start Date End Date Taking? Authorizing Provider  almotriptan (AXERT) 12.5 MG tablet Take by mouth as needed.    Yes Historical Provider, MD  Multiple Vitamin (MULTIVITAMIN) tablet Take 1 tablet by mouth daily.   Yes Historical Provider, MD  naproxen (NAPROSYN) 500 MG tablet Take 1 tablet (500 mg total) by mouth 3 (three) times daily with meals. 03/23/14  Yes Lavonia Drafts, MD    Allergies  Allergen Reactions  . Erythromycin Shortness Of Breath and Other (See Comments)  . Imitrex  [Sumatriptan]     neck muscle tightness    Past Surgical History:  Procedure Laterality Date  . 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 LIPOMA EXCISION Left 10/31/2015   Procedure: EXCISION LIPOMA;  Surgeon: Clayburn Pert, MD;  Location: [facility] ORS;  Service: General;  Laterality: Left;  .  OVARIAN CYST REMOVAL Left 05/02/2009  . OVARIAN CYST REMOVAL Left 09/23/2006    Social History  Substance Use Topics  . Smoking status: Never Smoker  . Smokeless tobacco: Never Used  . Alcohol use 1.2 oz/week    2 Standard drinks or equivalent per week     Comment: rare     Medication list has been reviewed and updated.   Physical Exam  Constitutional: She is oriented to person, place, and time. She appears well-developed. No distress.  HENT:  Head: Normocephalic and atraumatic.  Pulmonary/Chest: Effort normal. No respiratory distress.  Musculoskeletal:       Left knee: She exhibits decreased range of motion (pain with flexion). She exhibits no swelling. Tenderness found. Lateral joint line tenderness noted.  Neurological: She is alert and oriented to person, place, and time.  Skin: Skin is warm and dry. No rash noted.  Psychiatric: She has a normal mood and affect. Her behavior is normal. Thought content normal.  Nursing note and vitals reviewed.   BP 122/82   Pulse 86   Temp 97.6 F (36.4 C)   Ht 5\' 4"  (1.626 m)   Wt 235 lb (106.6 kg)   SpO2 98%   BMI 40.34 kg/m   Assessment and Plan: 1. Knee strain, left, sequela Continue naproxen bid Refer to Orthopedics - naproxen (NAPROSYN) 500 MG tablet; Take 1 tablet (500 mg total) by mouth 3 (three) times daily with meals.  Dispense: 60 tablet; Refill: 1 - Ambulatory referral to Orthopedic Surgery  Halina Maidens, MD [facility]  12/01/2016

## 2016-12-15 DIAGNOSIS — M7650 Patellar tendinitis, unspecified knee: Secondary | ICD-10-CM

## 2016-12-15 HISTORY — DX: Patellar tendinitis, unspecified knee: M76.50

## 2017-02-04 ENCOUNTER — Ambulatory Visit: Payer: Self-pay | Admitting: Obstetrics and Gynecology

## 2017-02-10 ENCOUNTER — Ambulatory Visit (INDEPENDENT_AMBULATORY_CARE_PROVIDER_SITE_OTHER): Payer: 59 | Admitting: Obstetrics and Gynecology

## 2017-02-10 ENCOUNTER — Encounter: Payer: Self-pay | Admitting: Obstetrics and Gynecology

## 2017-02-10 VITALS — BP 129/80 | HR 83 | Ht 64.0 in | Wt 232.0 lb

## 2017-02-10 DIAGNOSIS — Z01419 Encounter for gynecological examination (general) (routine) without abnormal findings: Secondary | ICD-10-CM | POA: Diagnosis not present

## 2017-02-10 DIAGNOSIS — Z0000 Encounter for general adult medical examination without abnormal findings: Secondary | ICD-10-CM | POA: Diagnosis not present

## 2017-02-13 LAB — PAP IG AND HPV HIGH-RISK
HPV, HIGH-RISK: NEGATIVE
PAP SMEAR COMMENT: 0

## 2017-02-15 ENCOUNTER — Encounter: Payer: Self-pay | Admitting: Obstetrics and Gynecology

## 2017-02-15 NOTE — Progress Notes (Signed)
Obstetrics and Gynecology Visit Annual Patient Evaluation  Appointment Date: 02/10/2017  OBGYN Clinic: [facility]  Primary Care Provider: Halina Maidens  Chief Complaint:  Chief Complaint  Patient presents with  . Gynecologic Exam    History of Present Illness: Dawn Cain is a 36 y.o. Caucasian G1P0010 (Patient's last menstrual period was 01/21/2017.), seen for the above chief complaint. Her past medical history is significant for h/o endometriosis, infertility  The only issue she notes is that her periods are somewhat more painful than in prior years, but they are still regular, not heavy and last for about 5 days.   No breast s/s, fevers, chills, chest pain, SOB, nausea, vomiting, abdominal pain, dysuria, hematuria, vaginal itching, dyspareunia, diarrhea, constipation, blood in BMs  Review of Systems: as noted in the History of Present Illness.  Past Medical History:  Past Medical History:  Diagnosis Date  . Headache   . Heel spur, right   . Infertility    trying for 11 years.  . Lipoma of back   . Miscarriage 03/23/2011    Past Surgical History:  Past Surgical History:  Procedure Laterality Date  . 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 LIPOMA EXCISION Left 10/31/2015   Procedure: EXCISION LIPOMA;  Surgeon: Clayburn Pert, MD;  Location: [facility] ORS;  Service: General;  Laterality: Left;  . OVARIAN CYST REMOVAL Left 05/02/2009  . OVARIAN CYST REMOVAL Left 09/23/2006    Past Obstetrical History:  OB History  Gravida Para Term Preterm AB Living  1 0 0 0 1 0  SAB TAB Ectopic Multiple Live Births  1 0 0 0      # Outcome Date GA Lbr Len/2nd Weight Sex Delivery Anes PTL Lv  1 SAB               Past Gynecological History: As per HPI. Periods: as above No. history of abnormal pap smears (last pap smear: 2015, which was NILM) No. history of STIs.   She is currently using nothing for contraception.   Social  History:  Social History   Social History  . Marital status: Married    Spouse name: N/A  . Number of children: N/A  . Years of education: N/A   Occupational History  . Not on file.   Social History Main Topics  . Smoking status: Never Smoker  . Smokeless tobacco: Never Used  . Alcohol use 1.2 oz/week    2 Standard drinks or equivalent per week     Comment: rare  . Drug use: No  . Sexual activity: Yes    Partners: Male    Birth control/ protection: None     Comment: trying to conceive   Other Topics Concern  . Not on file   Social History Narrative  . No narrative on file    Family History:  Family History  Problem Relation Age of Onset  . Hypertension Father   . Emphysema Maternal Grandmother   . Cancer Maternal Grandfather     Lung  . Heart disease Paternal Grandmother   . Cancer Paternal Grandfather     stomach   She denies any female cancers, bleeding or blood clotting disorders.   Health Maintenance:  2017 breast u/s which was negative-->recommend 36 y/o routine screening.   Medications Ms. Auriemma had no medications administered during this visit. Current Outpatient Prescriptions  Medication Sig Dispense Refill  . Multiple Vitamin (MULTIVITAMIN) tablet Take 1 tablet  by mouth daily.    . naproxen (NAPROSYN) 500 MG tablet Take 1 tablet (500 mg total) by mouth 3 (three) times daily with meals. 60 tablet 1  . almotriptan (AXERT) 12.5 MG tablet Take by mouth as needed.      No current facility-administered medications for this visit.     Allergies Erythromycin and Imitrex  [sumatriptan]   Physical Exam:  BP 129/80 (BP Location: Left Arm, Patient Position: Sitting, Cuff Size: Large)   Pulse 83   Ht 5\' 4"  (1.626 m)   Wt 232 lb (105.2 kg)   LMP 01/21/2017   BMI 39.82 kg/m  Body mass index is 39.82 kg/m. General appearance: Well nourished, well developed female in no acute distress.  Neck:  Supple, normal appearance, and no thyromegaly   Cardiovascular: normal s1 and s2.  No murmurs, rubs or gallops. Respiratory:  Clear to auscultation bilateral. Normal respiratory effort Abdomen: positive bowel sounds and no masses, hernias; diffusely non tender to palpation, non distended Breasts: breasts appear normal, no suspicious masses, no skin or nipple changes or axillary nodes, and negative palpation. Neuro/Psych:  Normal mood and affect.  Skin:  Warm and dry.  Lymphatic:  No inguinal lymphadenopathy.   Pelvic exam: is not limited by body habitus EGBUS: within normal limits, Vagina: within normal limits and with no blood or discharge in the vault, Cervix: normal appearing cervix without tenderness, discharge or lesions. Uterus:  nonenlarged and non tender and Adnexa:  normal adnexa and no mass, fullness, tenderness Rectovaginal: deferred  Laboratory: none  Radiology: none  Assessment: pt doing well  Plan:  1. Encounter for gynecological examination without abnormal finding Routine care. D/w her re: common to have change in periods near the age of 49 and d/w her re: options that can help and also serve as BC, namely LNG IUD. Pt declines and okay with exp management. 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  - Pap IG and HPV (high risk) DNA detection  RTC 1 year.   Durene Romans MD Attending [facility] [employer] [facility])

## 2017-02-23 ENCOUNTER — Other Ambulatory Visit: Payer: Self-pay | Admitting: Podiatry

## 2017-03-14 HISTORY — PX: HEEL SPUR SURGERY: SHX665

## 2017-03-27 IMAGING — MG MM DIAG BREAST TOMO BILATERAL
8 of 17 series · 8 of 40 positions shown · non-contrast
Comparison: No prior exams.

CLINICAL DATA: Palpable lump within the lower inner quadrant of the
right breast.

This is patient's baseline mammogram.
EXAM:
DIGITAL DIAGNOSTIC BILATERAL MAMMOGRAM WITH 3D TOMOSYNTHESIS WITH
CAD
ULTRASOUND RIGHT BREAST

[R MLO (1 of 2)]
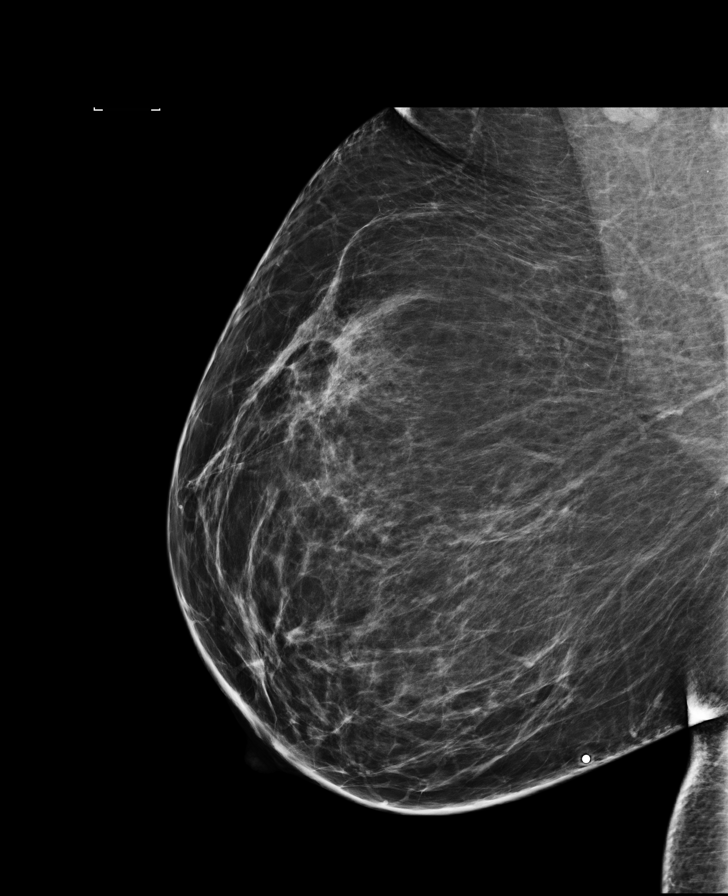

[L CC synth-2D]
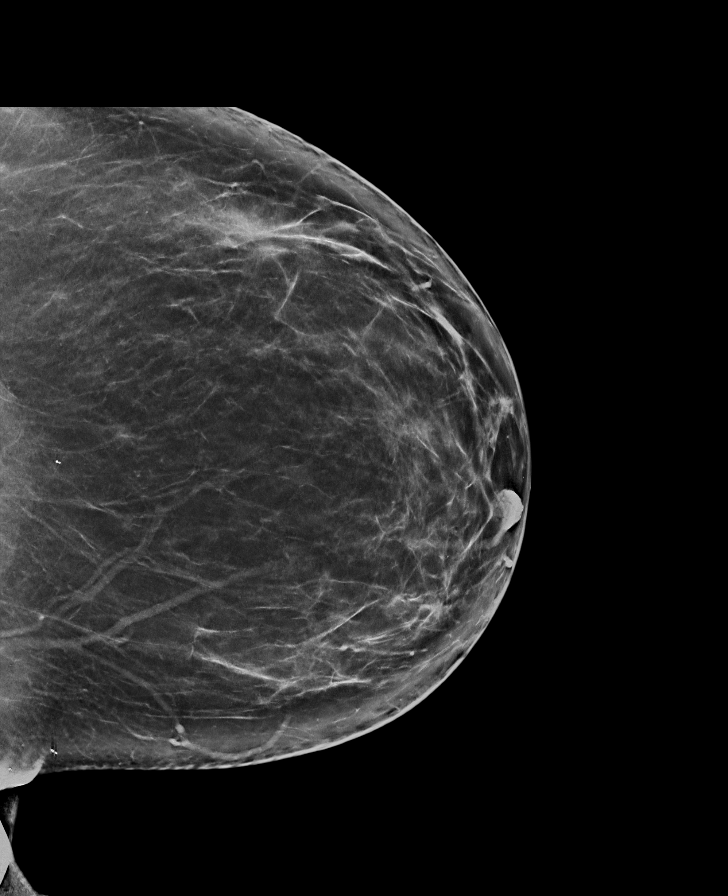

[R CC (1 of 2)]
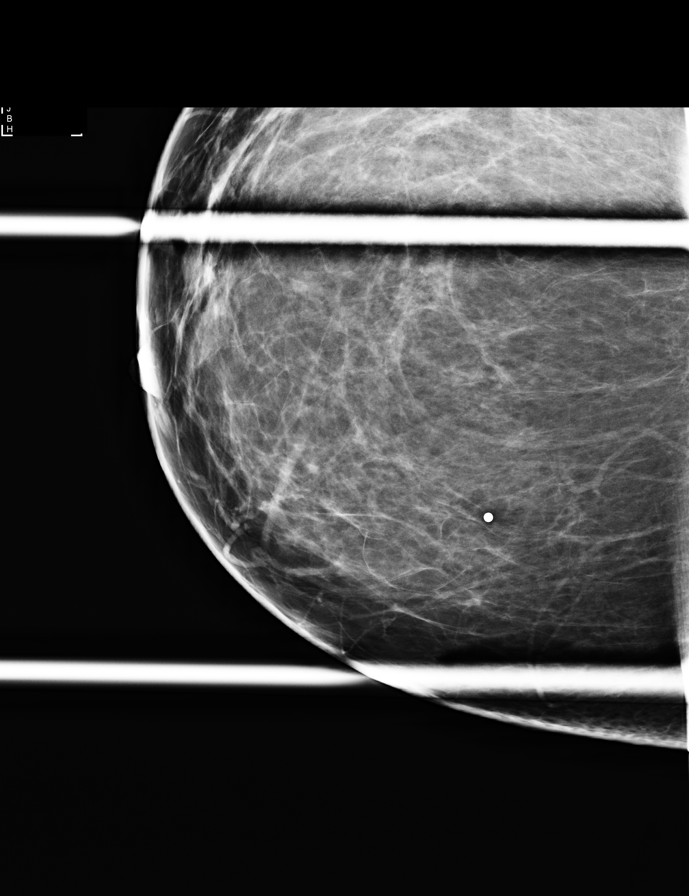

[R CC (2 of 2)]
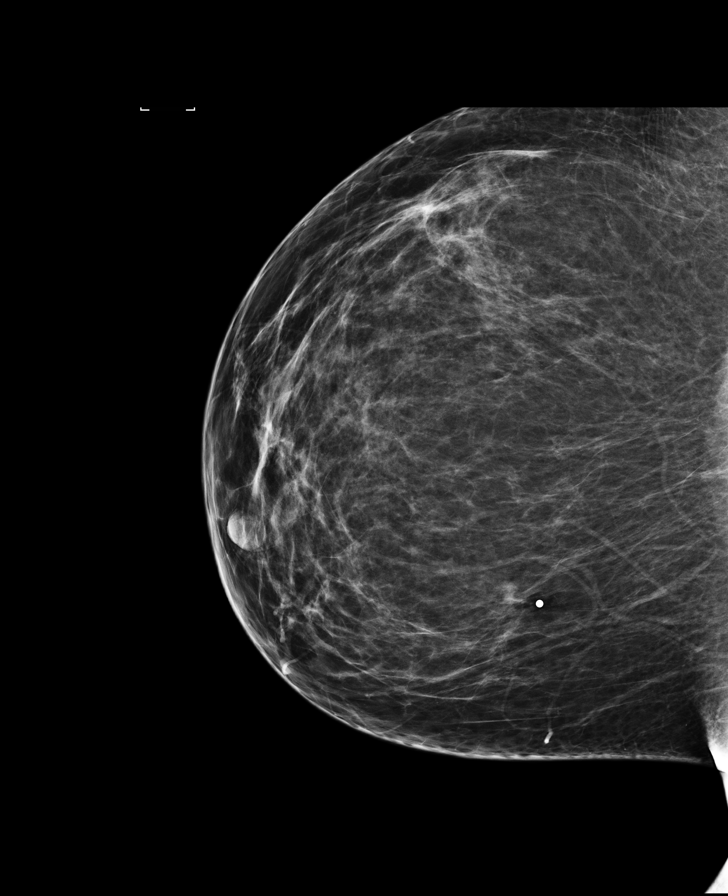

[R MLO (2 of 2)]
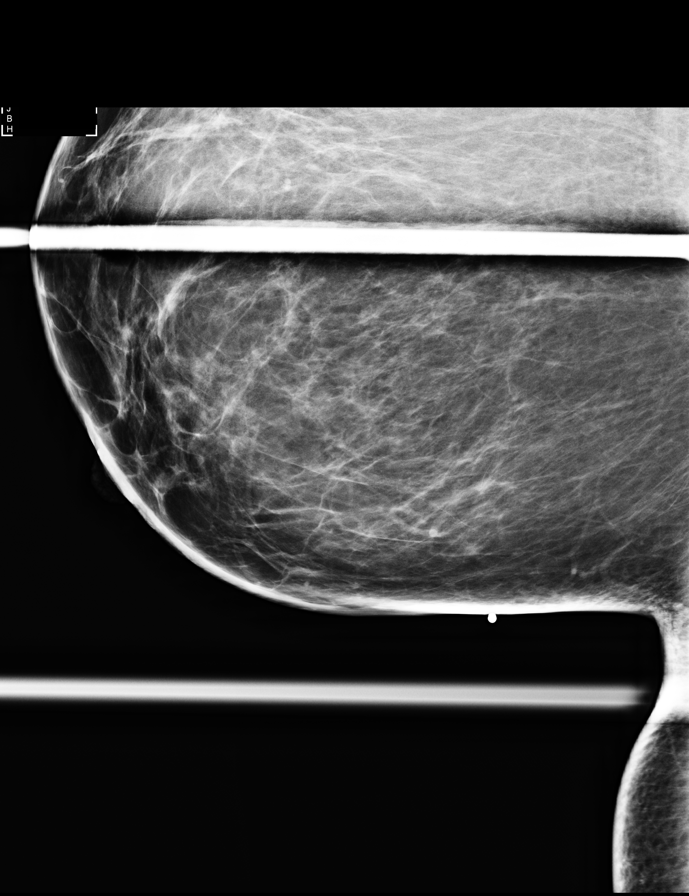

[L MLO]
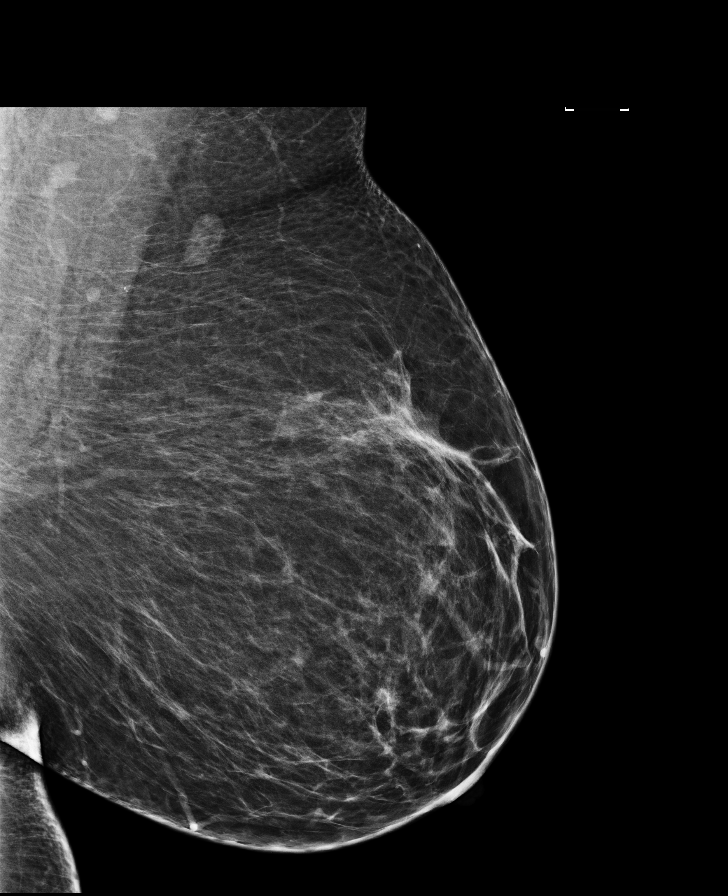

[L CC]
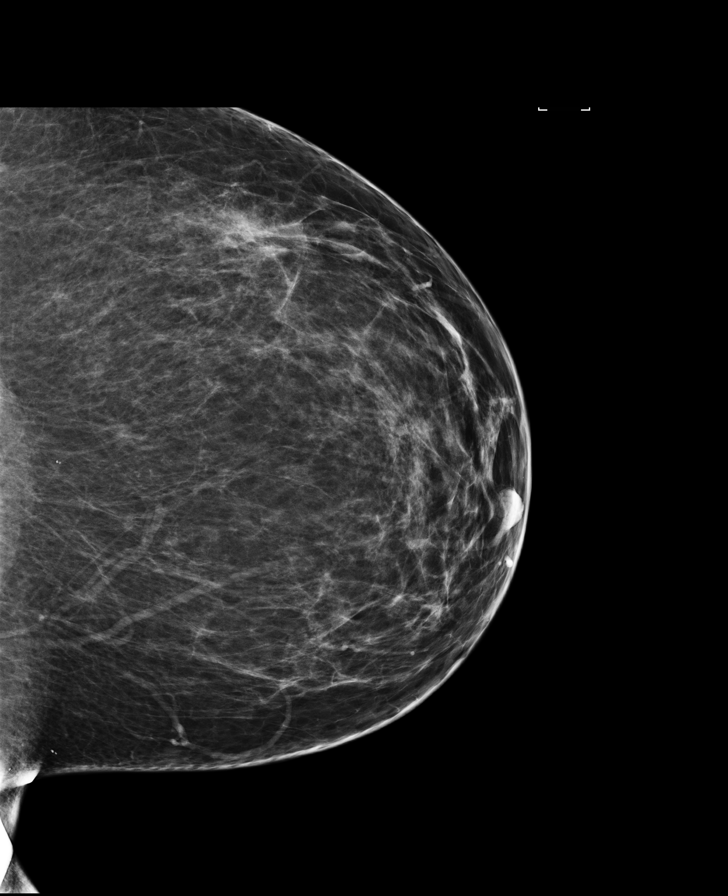

[R MLO synth-2D]
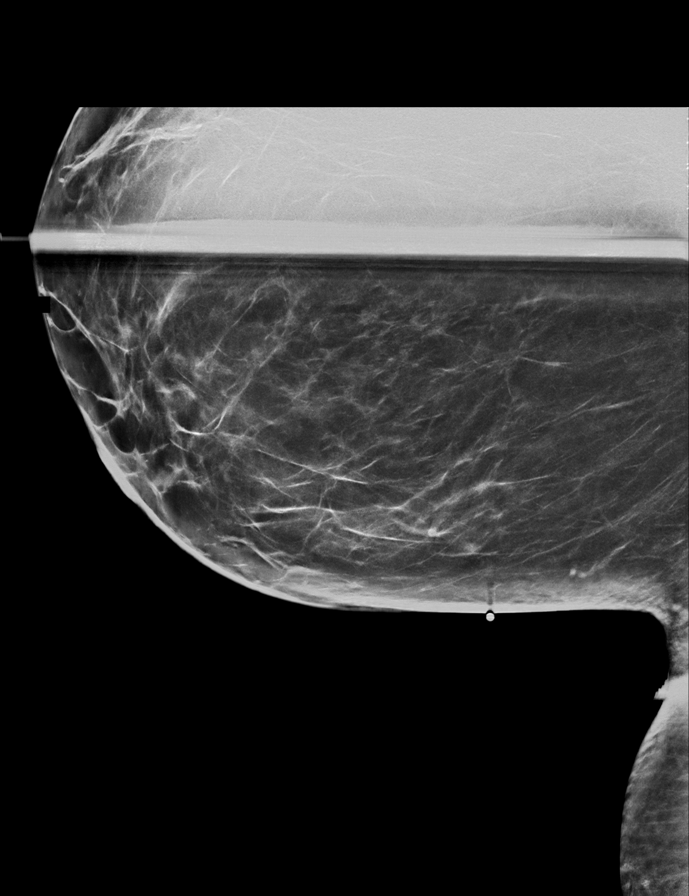

[8 of 40 positions shown; findings below may reference images not displayed]

This is patient's baseline mammogram.

ACR Breast Density Category b: There are scattered areas of
fibroglandular density.
FINDINGS: Bilateral CC and MLO projections were obtained with 3D
tomosynthesis. Additional spot compression views of the right
breast, with 3D tomosynthesis, were obtained for the area of the
palpable abnormality, with overlying skin marker in place.

There are no dominant masses, suspicious calcifications or secondary
signs of malignancy within either breast.

Specifically, there is no mammographic abnormality within the lower
inner quadrant of the right breast corresponding to the area of
clinical concern.

Mammographic images were processed with CAD.

On physical exam, there is vague soft tissue thickening within the
inferior right breast without evidence of circumscribed mass.

Targeted ultrasound is performed, evaluating the entire lower right
breast with particular attention to the 5-6 o'clock axis region as
directed by the patient, showing only normal fibroglandular tissues
and fat lobules throughout. No suspicious solid or cystic masses are
identified by ultrasound.
IMPRESSION: No evidence of malignancy within either breast. Specifically, no
evidence of malignancy within the lower right breast corresponding
to the area of clinical concern.

RECOMMENDATION:
Screening mammogram at age 38 unless there are persistent or
intervening clinical concerns. (Code:F9-E-5K6)

The patient was instructed to return sooner if the area that she
feels becomes larger and/or firmer to palpation.

I have discussed the findings and recommendations with the patient.
Results were also provided in writing at the conclusion of the
visit. If applicable, a reminder letter will be sent to the patient
regarding the next appointment.

BI-RADS CATEGORY  1: Negative.

## 2017-03-31 ENCOUNTER — Encounter
Admission: RE | Admit: 2017-03-31 | Discharge: 2017-03-31 | Disposition: A | Discharge status: Home / Self Care | Payer: 59 | Source: Ambulatory Visit | Attending: Podiatry | Admitting: Podiatry

## 2017-03-31 HISTORY — DX: Gastro-esophageal reflux disease without esophagitis: K21.9

## 2017-03-31 NOTE — Patient Instructions (Signed)
  Your procedure is scheduled on: 04-09-17 Report to Same Day Surgery 2nd floor medical mall [facility] Entrance-take elevator on left to 2nd floor.  Check in with surgery information desk.) To find out your arrival time please call [phone] between 1PM - 3PM on 04-08-17  Remember: Instructions that are not followed completely may result in serious medical risk, up to and including death, or upon the discretion of your surgeon and anesthesiologist your surgery may need to be rescheduled.    _x___ 1. Do not eat food or drink liquids after midnight. No gum chewing or hard candies.     __x__ 2. No Alcohol for 24 hours before or after surgery.   __x__3. No Smoking for 24 prior to surgery.   ____  4. Bring all medications with you on the day of surgery if instructed.    __x__ 5. Notify your doctor if there is any change in your medical condition     (cold, fever, infections).     Do not wear jewelry, make-up, hairpins, clips or nail polish.  Do not wear lotions, powders, or perfumes. You may wear deodorant.  Do not shave 48 hours prior to surgery. Men may shave face and neck.  Do not bring valuables to the hospital.    [facility] is not responsible for any belongings or valuables.               Contacts, dentures or bridgework may not be worn into surgery.  Leave your suitcase in the car. After surgery it may be brought to your room.  For patients admitted to the hospital, discharge time is determined by your                       treatment team.   Patients discharged the day of surgery will not be allowed to drive home.  You will need someone to drive you home and stay with you the night of your procedure.    Please read over the following fact sheets that you were given:   [facility] Preparing for Surgery and or MRSA Information   ____ Take anti-hypertensive (unless it includes a diuretic), cardiac, seizure, asthma,     anti-reflux and psychiatric medicines. These include:  1.  NONE  2.  3.  4.  5.  6.  ____Fleets enema or Magnesium Citrate as directed.   ___ Use CHG Soap or sage wipes as directed on instruction sheet   ____ Use inhalers on the day of surgery and bring to hospital day of surgery  ____ Stop Metformin and Janumet 2 days prior to surgery.    ____ Take 1/2 of usual insulin dose the night before surgery and none on the morning surgery.   ____ Follow recommendations from Cardiologist, Pulmonologist or PCP regarding stopping Aspirin, Coumadin, Pllavix ,Eliquis, Effient, or Pradaxa, and Pletal.  X____Stop Anti-inflammatories such as Advil, Aleve, Ibuprofen, Motrin, NAPROXEN, Naprosyn, Goodies powders or aspirin products NOW-OK to take Tylenol    ____ Stop supplements until after surgery.    ____ Bring C-Pap to the hospital.

## 2017-04-08 MED ORDER — CEFAZOLIN SODIUM-DEXTROSE 2-4 GM/100ML-% IV SOLN
2.0000 g | INTRAVENOUS | Status: AC
  Administered 2017-04-09: 2 g via INTRAVENOUS

## 2017-04-09 ENCOUNTER — Ambulatory Visit: Payer: 59 | Admitting: Anesthesiology

## 2017-04-09 ENCOUNTER — Ambulatory Visit
Admission: RE | Admit: 2017-04-09 | Discharge: 2017-04-09 | Disposition: A | Discharge status: Home / Self Care | Payer: 59 | Source: Ambulatory Visit | Attending: Podiatry | Admitting: Podiatry

## 2017-04-09 ENCOUNTER — Encounter
Admission: RE | Disposition: A | Discharge status: Home / Self Care | Payer: Self-pay | Source: Ambulatory Visit | Attending: Podiatry

## 2017-04-09 ENCOUNTER — Encounter: Payer: Self-pay | Admitting: *Deleted

## 2017-04-09 DIAGNOSIS — Z6839 Body mass index (BMI) 39.0-39.9, adult: Secondary | ICD-10-CM | POA: Insufficient documentation

## 2017-04-09 DIAGNOSIS — M7661 Achilles tendinitis, right leg: Secondary | ICD-10-CM | POA: Insufficient documentation

## 2017-04-09 DIAGNOSIS — M899 Disorder of bone, unspecified: Secondary | ICD-10-CM | POA: Insufficient documentation

## 2017-04-09 HISTORY — PX: ACHILLES TENDON SURGERY: SHX542

## 2017-04-09 HISTORY — PX: OSTECTOMY: SHX6439

## 2017-04-09 LAB — POCT PREGNANCY, URINE: Preg Test, Ur: NEGATIVE

## 2017-04-09 SURGERY — REPAIR, TENDON, ACHILLES
Anesthesia: Regional | Site: Foot | Laterality: Right | Wound class: Clean

## 2017-04-09 MED ORDER — FAMOTIDINE 20 MG PO TABS
ORAL_TABLET | ORAL | Status: AC
  Administered 2017-04-09: 20 mg via ORAL
  Filled 2017-04-09: qty 1

## 2017-04-09 MED ORDER — ONDANSETRON HCL 4 MG/2ML IJ SOLN
INTRAMUSCULAR | Status: AC
  Filled 2017-04-09: qty 2

## 2017-04-09 MED ORDER — PROMETHAZINE HCL 25 MG/ML IJ SOLN
6.2500 mg | INTRAMUSCULAR | Status: DC | PRN
  Administered 2017-04-09: 12.5 mg via INTRAVENOUS

## 2017-04-09 MED ORDER — PROPOFOL 10 MG/ML IV BOLUS
INTRAVENOUS | Status: AC
Start: 2017-04-09 — End: 2017-04-09
  Filled 2017-04-09: qty 20

## 2017-04-09 MED ORDER — LIDOCAINE HCL (PF) 2 % IJ SOLN
INTRAMUSCULAR | Status: AC
  Filled 2017-04-09: qty 2

## 2017-04-09 MED ORDER — SUGAMMADEX SODIUM 200 MG/2ML IV SOLN
INTRAVENOUS | Status: AC
  Filled 2017-04-09: qty 2

## 2017-04-09 MED ORDER — ROPIVACAINE HCL 5 MG/ML IJ SOLN
INTRAMUSCULAR | Status: AC
  Filled 2017-04-09: qty 30

## 2017-04-09 MED ORDER — DEXAMETHASONE SODIUM PHOSPHATE 10 MG/ML IJ SOLN
INTRAMUSCULAR | Status: DC | PRN
  Administered 2017-04-09: 10 mg via INTRAVENOUS

## 2017-04-09 MED ORDER — FENTANYL CITRATE (PF) 100 MCG/2ML IJ SOLN
INTRAMUSCULAR | Status: DC | PRN
  Administered 2017-04-09: 100 ug via INTRAVENOUS

## 2017-04-09 MED ORDER — LIDOCAINE-EPINEPHRINE (PF) 1 %-1:200000 IJ SOLN
INTRAMUSCULAR | Status: AC
  Filled 2017-04-09: qty 30

## 2017-04-09 MED ORDER — LIDOCAINE HCL (PF) 1 % IJ SOLN
INTRAMUSCULAR | Status: AC
  Filled 2017-04-09: qty 30

## 2017-04-09 MED ORDER — ONDANSETRON HCL 4 MG/2ML IJ SOLN
4.0000 mg | Freq: Four times a day (QID) | INTRAMUSCULAR | Status: DC | PRN
  Administered 2017-04-09: 4 mg via INTRAVENOUS

## 2017-04-09 MED ORDER — ONDANSETRON HCL 4 MG PO TABS: 4.0000 mg | ORAL_TABLET | Freq: Four times a day (QID) | ORAL | Status: DC | PRN

## 2017-04-09 MED ORDER — BUPIVACAINE HCL (PF) 0.25 % IJ SOLN
INTRAMUSCULAR | Status: AC
  Filled 2017-04-09: qty 30

## 2017-04-09 MED ORDER — SODIUM CHLORIDE 0.9 % IJ SOLN
INTRAMUSCULAR | Status: AC
  Filled 2017-04-09: qty 20

## 2017-04-09 MED ORDER — LIDOCAINE HCL (CARDIAC) 20 MG/ML IV SOLN
INTRAVENOUS | Status: DC | PRN
Start: 2017-04-09 — End: 2017-04-09
  Administered 2017-04-09: 100 mg via INTRAVENOUS

## 2017-04-09 MED ORDER — OXYCODONE-ACETAMINOPHEN 5-325 MG PO TABS: 1.0000 | ORAL_TABLET | ORAL | 0 refills | Status: DC | PRN

## 2017-04-09 MED ORDER — ASPIRIN EC 325 MG PO TBEC: 325.0000 mg | DELAYED_RELEASE_TABLET | Freq: Every day | ORAL | 0 refills | Status: DC

## 2017-04-09 MED ORDER — FENTANYL CITRATE (PF) 100 MCG/2ML IJ SOLN
INTRAMUSCULAR | Status: AC
  Filled 2017-04-09: qty 2

## 2017-04-09 MED ORDER — PROMETHAZINE HCL 25 MG/ML IJ SOLN
INTRAMUSCULAR | Status: AC
  Administered 2017-04-09: 12.5 mg via INTRAVENOUS
  Filled 2017-04-09: qty 1

## 2017-04-09 MED ORDER — MIDAZOLAM HCL 2 MG/2ML IJ SOLN
1.0000 mg | Freq: Once | INTRAMUSCULAR | Status: AC
  Administered 2017-04-09: 1 mg via INTRAVENOUS

## 2017-04-09 MED ORDER — MIDAZOLAM HCL 2 MG/2ML IJ SOLN
INTRAMUSCULAR | Status: AC
  Filled 2017-04-09: qty 2

## 2017-04-09 MED ORDER — PROPOFOL 10 MG/ML IV BOLUS
INTRAVENOUS | Status: DC | PRN
  Administered 2017-04-09: 180 mg via INTRAVENOUS

## 2017-04-09 MED ORDER — MIDAZOLAM HCL 2 MG/2ML IJ SOLN
INTRAMUSCULAR | Status: AC
  Administered 2017-04-09: 1 mg via INTRAVENOUS
  Filled 2017-04-09: qty 2

## 2017-04-09 MED ORDER — LACTATED RINGERS IV SOLN
INTRAVENOUS | Status: DC
  Administered 2017-04-09: 07:00:00 via INTRAVENOUS

## 2017-04-09 MED ORDER — LIDOCAINE-EPINEPHRINE (PF) 1 %-1:200000 IJ SOLN
INTRAMUSCULAR | Status: DC | PRN
  Administered 2017-04-09: 6 mL

## 2017-04-09 MED ORDER — ACETAMINOPHEN 10 MG/ML IV SOLN
INTRAVENOUS | Status: DC | PRN
  Administered 2017-04-09: 1000 mg via INTRAVENOUS

## 2017-04-09 MED ORDER — ROCURONIUM BROMIDE 50 MG/5ML IV SOLN
INTRAVENOUS | Status: AC
  Filled 2017-04-09: qty 1

## 2017-04-09 MED ORDER — FENTANYL CITRATE (PF) 100 MCG/2ML IJ SOLN: 25.0000 ug | INTRAMUSCULAR | Status: DC | PRN

## 2017-04-09 MED ORDER — OXYCODONE-ACETAMINOPHEN 5-325 MG PO TABS: 1.0000 | ORAL_TABLET | ORAL | Status: DC | PRN

## 2017-04-09 MED ORDER — ROCURONIUM BROMIDE 100 MG/10ML IV SOLN
INTRAVENOUS | Status: DC | PRN
  Administered 2017-04-09: 50 mg via INTRAVENOUS
  Administered 2017-04-09: 20 mg via INTRAVENOUS
  Administered 2017-04-09: 10 mg via INTRAVENOUS
  Administered 2017-04-09: 5 mg via INTRAVENOUS

## 2017-04-09 MED ORDER — LIDOCAINE HCL (PF) 1 % IJ SOLN
INTRAMUSCULAR | Status: AC
  Filled 2017-04-09: qty 5

## 2017-04-09 MED ORDER — CHLORHEXIDINE GLUCONATE 4 % EX LIQD: 60.0000 mL | Freq: Once | CUTANEOUS | Status: DC

## 2017-04-09 MED ORDER — FENTANYL CITRATE (PF) 100 MCG/2ML IJ SOLN
INTRAMUSCULAR | Status: AC
  Administered 2017-04-09: 50 ug via INTRAVENOUS
  Filled 2017-04-09: qty 2

## 2017-04-09 MED ORDER — BUPIVACAINE HCL (PF) 0.5 % IJ SOLN
INTRAMUSCULAR | Status: AC
  Filled 2017-04-09: qty 30

## 2017-04-09 MED ORDER — PHENYLEPHRINE HCL 10 MG/ML IJ SOLN
INTRAMUSCULAR | Status: DC | PRN
Start: 2017-04-09 — End: 2017-04-09
  Administered 2017-04-09: 100 ug via INTRAVENOUS

## 2017-04-09 MED ORDER — CEFAZOLIN SODIUM-DEXTROSE 2-4 GM/100ML-% IV SOLN
INTRAVENOUS | Status: AC
  Filled 2017-04-09: qty 100

## 2017-04-09 MED ORDER — MIDAZOLAM HCL 2 MG/2ML IJ SOLN
INTRAMUSCULAR | Status: DC | PRN
  Administered 2017-04-09: 2 mg via INTRAVENOUS

## 2017-04-09 MED ORDER — ACETAMINOPHEN 10 MG/ML IV SOLN
INTRAVENOUS | Status: AC
  Filled 2017-04-09: qty 100

## 2017-04-09 MED ORDER — SEVOFLURANE IN SOLN
RESPIRATORY_TRACT | Status: AC
  Filled 2017-04-09: qty 250

## 2017-04-09 MED ORDER — ONDANSETRON HCL 4 MG/2ML IJ SOLN
INTRAMUSCULAR | Status: DC | PRN
  Administered 2017-04-09: 4 mg via INTRAVENOUS

## 2017-04-09 MED ORDER — FAMOTIDINE 20 MG PO TABS
20.0000 mg | ORAL_TABLET | Freq: Once | ORAL | Status: AC
  Administered 2017-04-09: 20 mg via ORAL

## 2017-04-09 MED ORDER — SUGAMMADEX SODIUM 200 MG/2ML IV SOLN
INTRAVENOUS | Status: DC | PRN
  Administered 2017-04-09: 200 mg via INTRAVENOUS

## 2017-04-09 MED ORDER — FENTANYL CITRATE (PF) 100 MCG/2ML IJ SOLN
50.0000 ug | Freq: Once | INTRAMUSCULAR | Status: AC
  Administered 2017-04-09: 50 ug via INTRAVENOUS

## 2017-04-09 SURGICAL SUPPLY — 56 items
ANCHOR SUT 5.5 SPEEDSCREW (Screw) ×6 IMPLANT
BANDAGE ELASTIC 4 LF NS (GAUZE/BANDAGES/DRESSINGS) ×4 IMPLANT
BANDAGE STRETCH 3X4.1 STRL (GAUZE/BANDAGES/DRESSINGS) ×2 IMPLANT
BLADE SURG 15 STRL LF DISP TIS (BLADE) ×2 IMPLANT
BLADE SURG 15 STRL SS (BLADE) ×2
BLADE SURG MINI STRL (BLADE) ×2 IMPLANT
BNDG ESMARK 4X12 TAN STRL LF (GAUZE/BANDAGES/DRESSINGS) ×2 IMPLANT
BNDG ESMARK 6X12 TAN STRL LF (GAUZE/BANDAGES/DRESSINGS) ×2 IMPLANT
BNDG GAUZE 4.5X4.1 6PLY STRL (MISCELLANEOUS) ×2 IMPLANT
CANISTER SUCT 1200ML W/VALVE (MISCELLANEOUS) ×2 IMPLANT
DRAPE FLUOR MINI C-ARM 54X84 (DRAPES) ×2 IMPLANT
DURAPREP 26ML APPLICATOR (WOUND CARE) ×2 IMPLANT
ELECT REM PT RETURN 9FT ADLT (ELECTROSURGICAL) ×2
ELECTRODE REM PT RTRN 9FT ADLT (ELECTROSURGICAL) ×1 IMPLANT
GAUZE PETRO XEROFOAM 1X8 (MISCELLANEOUS) ×2 IMPLANT
GAUZE SPONGE 4X4 12PLY STRL (GAUZE/BANDAGES/DRESSINGS) ×2 IMPLANT
GAUZE STRETCH 2X75IN STRL (MISCELLANEOUS) ×2 IMPLANT
GLOVE BIO SURGEON STRL SZ7.5 (GLOVE) ×2 IMPLANT
GLOVE INDICATOR 8.0 STRL GRN (GLOVE) ×2 IMPLANT
GOWN STRL REUS W/ TWL LRG LVL3 (GOWN DISPOSABLE) ×2 IMPLANT
GOWN STRL REUS W/TWL LRG LVL3 (GOWN DISPOSABLE) ×2
HANDLE YANKAUER SUCT BULB TIP (MISCELLANEOUS) ×2 IMPLANT
KIT RM TURNOVER STRD PROC AR (KITS) ×2 IMPLANT
LABEL OR SOLS (LABEL) ×2 IMPLANT
NDL MAYO CATGUT SZ5 (NEEDLE)
NDL SUT 5 .5 CRC TPR PNT MAYO (NEEDLE) IMPLANT
NEEDLE FILTER BLUNT 18X 1/2SAF (NEEDLE) ×1
NEEDLE FILTER BLUNT 18X1 1/2 (NEEDLE) ×1 IMPLANT
NEEDLE HYPO 25X1 1.5 SAFETY (NEEDLE) ×4 IMPLANT
NS IRRIG 500ML POUR BTL (IV SOLUTION) ×2 IMPLANT
PACK EXTREMITY ARMC (MISCELLANEOUS) ×2 IMPLANT
PAD CAST CTTN 4X4 STRL (SOFTGOODS) ×2 IMPLANT
PADDING CAST COTTON 4X4 STRL (SOFTGOODS) ×2
RASP SM TEAR CROSS CUT (RASP) ×2 IMPLANT
SPLINT CAST 1 STEP 5X30 WHT (MISCELLANEOUS) ×2 IMPLANT
SPLINT FAST PLASTER 5X30 (CAST SUPPLIES) ×1
SPLINT PLASTER CAST FAST 5X30 (CAST SUPPLIES) ×1 IMPLANT
SPONGE LAP 18X18 5 PK (GAUZE/BANDAGES/DRESSINGS) IMPLANT
STOCKINETTE M/LG 89821 (MISCELLANEOUS) ×2 IMPLANT
STRIP CLOSURE SKIN 1/2X4 (GAUZE/BANDAGES/DRESSINGS) ×2 IMPLANT
SUT MNCRL+ 5-0 VIOLET P-3 (SUTURE) IMPLANT
SUT MONOCRYL 5-0 (SUTURE)
SUT PDS AB 0 CT1 27 (SUTURE) IMPLANT
SUT VIC AB 0 SH 27 (SUTURE) IMPLANT
SUT VIC AB 2-0 CT2 27 (SUTURE) ×2 IMPLANT
SUT VIC AB 2-0 SH 27 (SUTURE)
SUT VIC AB 2-0 SH 27XBRD (SUTURE) IMPLANT
SUT VIC AB 3-0 SH 27 (SUTURE)
SUT VIC AB 3-0 SH 27X BRD (SUTURE) IMPLANT
SUT VIC AB 4-0 FS2 27 (SUTURE) ×2 IMPLANT
SUT VICRYL AB 3-0 FS1 BRD 27IN (SUTURE) IMPLANT
SWABSTK COMLB BENZOIN TINCTURE (MISCELLANEOUS) ×2 IMPLANT
SYR 3ML LL SCALE MARK (SYRINGE) ×2 IMPLANT
SYRINGE 10CC LL (SYRINGE) ×4 IMPLANT
WAND TOPAZ MICRO DEBRIDER (MISCELLANEOUS) ×2 IMPLANT
WIRE MAGNUM (SUTURE) ×2 IMPLANT

## 2017-04-09 NOTE — Op Note (Signed)
Operative note   Surgeon:Briteny Fulghum Lawyer: None    Preop diagnosis: 1. Right Achilles chronic tendinitis 2. Right posterior calcaneal exostosis    Postop diagnosis: Same    Procedure: 1. Secondary repair right posterior Achilles tendon 2. Exostectomy right posterior calcaneus    EBL: Minimal    Anesthesia:regional and general    Hemostasis: Epinephrine infiltrated along the incision site with lidocaine.    Specimen: Posterior calcaneal exostosis and Achilles tendinitis    Complications: None    Operative indications:Dawn Cain is an 36 y.o. that presents today for surgical intervention.  The risks/benefits/alternatives/complications have been discussed and consent has been given.    Procedure:  Patient was brought into the OR and placed on the operating table in theprone position. The incision was infiltrated with 1% lidocaine with epinephrine. After anesthesia was obtained theright lower extremity was prepped and draped in usual sterile fashion.  Attention was directed to the posterior aspect of the right Achilles at its insertion. A longitudinal incision was performed. Sharp and blunt dissection carried down to the peritenon. The peritenon was then incised. At this time a tendon splitting incision was made down to the calcaneus. This was freed medial and lateral. There was a large posterior calcaneal exostosis with a small loose fragment in the central aspect of it. With the use of an osteotome I was able to remove the exostosis in its entirety. The residual posterior calcaneus was then smoothed with a power rasp. The chronic Achilles tendinitis and thickening of the Achilles tendon at its insertion was debrided with a 15 blade. This was then micro-debrided with a Topaz wand. The wound was flushed with copious amounts or irrigation. Good removal of the bone spur was noted on fluoroscopy. Closure of the deeper portion of the Achilles tendon was performed with a 2-0 Vicryl.  Next a magnum wire was placed proximal to distal and back proximal with a view nail type of suture. A 5.5 mm ArthroCare speech screw was placed in the posterior calcaneus. The tendon was brought down directly to the bone and tightened appropriately. A knotless stitch was performed within the suture anchor bone anchor. Good excursion with plantarflexion of the foot with squeezing the calf was noted and medially postoperatively. The wound was flushed with copious amounts or irrigation. The peritenon was closed with a 4-0 Vicryl in the subcutaneous tissue closed with a 4-0 Vicryl. The skin was closed with a 4-0 Monocryl undyed. A bulky sterile dressing was applied. Patient was then placed in a posterior splint with gravity equinus.    Patient tolerated the procedure and anesthesia well.  Was transported from the OR to the PACU with all vital signs stable and vascular status intact. To be discharged per routine protocol.  Will follow up in approximately 1 week in the outpatient clinic.

## 2017-04-09 NOTE — Anesthesia Postprocedure Evaluation (Signed)
Anesthesia Post Note  Patient: Dawn Cain  Procedure(s) Performed: Procedure(s) (LRB): ACHILLES TENDON REPAIR (Right) OSTECTOMY-[place]/Retrocalcaneal (Right)  Patient location during evaluation: PACU Anesthesia Type: Regional and General Level of consciousness: awake and alert Pain management: pain level controlled Vital Signs Assessment: post-procedure vital signs reviewed and stable Respiratory status: spontaneous breathing, nonlabored ventilation, respiratory function stable and patient connected to nasal cannula oxygen Cardiovascular status: blood pressure returned to baseline and stable Postop Assessment: no signs of nausea or vomiting Anesthetic complications: no     Last Vitals:  Vitals:   04/09/17 1159 04/09/17 1232  BP: 132/68 122/69  Pulse: 84 75  Resp: 16 16  Temp: 36.5 C     Last Pain:  Vitals:   04/09/17 1159  TempSrc:   PainSc: 0-No pain                 Martha Clan

## 2017-04-09 NOTE — Anesthesia Procedure Notes (Signed)
Procedure Name: Intubation Date/Time: 04/09/2017 7:49 AM Performed by: Hedda Slade Pre-anesthesia Checklist: Patient identified, Patient being monitored, Timeout performed, Emergency Drugs available and Suction available Patient Re-evaluated:Patient Re-evaluated prior to inductionOxygen Delivery Method: Circle system utilized Preoxygenation: Pre-oxygenation with 100% oxygen Intubation Type: IV induction Ventilation: Mask ventilation without difficulty Laryngoscope Size: Mac and 3 Grade View: Grade II Tube type: Oral Tube size: 7.0 mm Number of attempts: 1 Airway Equipment and Method: Stylet Placement Confirmation: ETT inserted through vocal cords under direct vision,  positive ETCO2 and breath sounds checked- equal and bilateral Secured at: 21 cm Tube secured with: Tape Dental Injury: Teeth and Oropharynx as per pre-operative assessment

## 2017-04-09 NOTE — H&P (Signed)
HISTORY AND PHYSICAL INTERVAL NOTE:  04/09/2017  7:19 AM  Dawn Cain  has presented today for surgery, with the diagnosis of Achilles Tendinitis-rt M76.61, Posterior Calcaneal Exostosis M77.31.  The various methods of treatment have been discussed with the patient.  No guarantees were given.  After consideration of risks, benefits and other options for treatment, the patient has consented to surgery.  I have reviewed the patients' chart and labs.    Patient Vitals for the past 24 hrs:  BP Temp Temp src Pulse Resp SpO2  04/09/17 0623 123/72 98 F (36.7 C) Oral 88 14 100 %    A history and physical examination was performed in my office.  The patient was reexamined.  There have been no changes to this history and physical examination. Plan for calcaneal exostectomy and achilles repair.  Samara Deist A

## 2017-04-09 NOTE — Transfer of Care (Signed)
Immediate Anesthesia Transfer of Care Note  Patient: Dawn Cain  Procedure(s) Performed: Procedure(s): ACHILLES TENDON REPAIR (Right) OSTECTOMY-[place]/Retrocalcaneal (Right)  Patient Location: PACU  Anesthesia Type:General  Level of Consciousness: awake, alert  and oriented  Airway & Oxygen Therapy: Patient Spontanous Breathing and Patient connected to face mask oxygen  Post-op Assessment: Report given to RN and Post -op Vital signs reviewed and stable  Post vital signs: Reviewed and stable  Last Vitals:  Vitals:   04/09/17 0731 04/09/17 1006  BP: 120/79 109/69  Pulse: 69 98  Resp: 18 (!) 22  Temp:  (!) 36.1 C    Last Pain:  Vitals:   04/09/17 1006  TempSrc: Temporal  PainSc:          Complications: No apparent anesthesia complications

## 2017-04-09 NOTE — Anesthesia Post-op Follow-up Note (Cosign Needed)
Anesthesia QCDR form completed.        

## 2017-04-09 NOTE — Discharge Instructions (Signed)
AMBULATORY SURGERY  DISCHARGE INSTRUCTIONS   1) The drugs that you were given will stay in your system until tomorrow so for the next 24 hours you should not:  A) Drive an automobile B) Make any legal decisions C) Drink any alcoholic beverage   2) You may resume regular meals tomorrow.  Today it is better to start with liquids and gradually work up to solid foods.  You may eat anything you prefer, but it is better to start with liquids, then soup and crackers, and gradually work up to solid foods.   3) Please notify your doctor immediately if you have any unusual bleeding, trouble breathing, redness and pain at the surgery site, drainage, fever, or pain not relieved by medication. 4)   5) Your post-operative visit with Dr.                                     is: Date:                        Time:    Please call to schedule your post-operative visit.  6) Additional Instructions:      [facility]  POST OPERATIVE INSTRUCTIONS FOR DR. Sorrento   1. Take your medication as prescribed.  Pain medication should be taken only as needed.  2. Keep the dressing clean, dry and intact.  3. Keep your foot elevated above the heart level for the first 48 hours.  4. Walking to the bathroom and brief periods of walking are acceptable, unless we have instructed you to be non-weight bearing.  5. Always wear your post-op shoe when walking.  Always use your crutches if you are to be non-weight bearing.  6. Do not take a shower. Baths are permissible as long as the foot is kept out of the water.   7. Every hour you are awake:  - Bend your knee 15 times.   8. Call [facility] [phone]) if any of the following problems occur: - You develop a temperature or fever. - The bandage becomes saturated with blood. - Medication does not stop your pain. - Injury of the foot occurs. - Any  symptoms of infection including redness, odor, or red streaks running from wound.

## 2017-04-09 NOTE — Anesthesia Preprocedure Evaluation (Signed)
Anesthesia Evaluation  Patient identified by MRN, date of birth, ID band Patient awake    Reviewed: Allergy & Precautions, H&P , NPO status , Patient's Chart, lab work & pertinent test results, reviewed documented beta blocker date and time   History of Anesthesia Complications Negative for: history of anesthetic complications  Airway Mallampati: I  TM Distance: >3 FB Neck ROM: full    Dental  (+) Caps, Teeth Intact   Pulmonary neg pulmonary ROS,           Cardiovascular Exercise Tolerance: Good negative cardio ROS       Neuro/Psych negative neurological ROS  negative psych ROS   GI/Hepatic negative GI ROS, Neg liver ROS,   Endo/Other  neg diabetesMorbid obesity  Renal/GU negative Renal ROS  negative genitourinary   Musculoskeletal   Abdominal   Peds  Hematology negative hematology ROS (+)   Anesthesia Other Findings Past Medical History: 02/27/2014: Endometriosis     Comment: dx'd laparoscopically   No date: GERD (gastroesophageal reflux disease) No date: Headache No date: Heel spur, right No date: Infertility     Comment: trying for 11 years. No date: Lipoma of back 03/23/2011: Miscarriage   Reproductive/Obstetrics negative OB ROS                             Anesthesia Physical Anesthesia Plan  ASA: II  Anesthesia Plan: General and Regional   Post-op Pain Management: GA combined w/ Regional for post-op pain   Induction:   Airway Management Planned:   Additional Equipment:   Intra-op Plan:   Post-operative Plan:   Informed Consent: I have reviewed the patients History and Physical, chart, labs and discussed the procedure including the risks, benefits and alternatives for the proposed anesthesia with the patient or authorized representative who has indicated his/her understanding and acceptance.   Dental Advisory Given  Plan Discussed with: Anesthesiologist, CRNA  and Surgeon  Anesthesia Plan Comments:         Anesthesia Quick Evaluation

## 2017-04-13 LAB — SURGICAL PATHOLOGY

## 2017-07-29 ENCOUNTER — Encounter: Payer: Self-pay | Admitting: Internal Medicine

## 2017-08-05 ENCOUNTER — Encounter: Payer: Self-pay | Admitting: Internal Medicine

## 2017-08-05 ENCOUNTER — Ambulatory Visit (INDEPENDENT_AMBULATORY_CARE_PROVIDER_SITE_OTHER): Payer: 59 | Admitting: Internal Medicine

## 2017-08-05 VITALS — BP 116/70 | HR 91 | Ht 64.0 in | Wt 233.0 lb

## 2017-08-05 DIAGNOSIS — G43829 Menstrual migraine, not intractable, without status migrainosus: Secondary | ICD-10-CM

## 2017-08-05 MED ORDER — ALMOTRIPTAN MALATE 12.5 MG PO TABS: 12.5000 mg | ORAL_TABLET | ORAL | 2 refills | Status: DC | PRN

## 2017-08-05 NOTE — Progress Notes (Signed)
Date:  08/05/2017   Name:  Dawn Cain   DOB:  1981-08-29   MRN:  [id number]   Chief Complaint: Migraine (Used to get them twice a year. Now more often. - Had one yesterday that last all day and still having it now. Has had 3 in this last month. So bad yesterday she has vomited. Wants to discuss getting back on Axert 12.5 mg )  Migraine   This is a recurrent problem. The problem has been unchanged. The pain is located in the retro-orbital and right unilateral region. The pain radiates to the face. The pain quality is similar to prior headaches. The quality of the pain is described as pulsating and throbbing. Associated symptoms include phonophobia, photophobia and vomiting. She has tried triptans for the symptoms. The treatment provided significant relief.     Review of Systems  Eyes: Positive for photophobia.  Gastrointestinal: Positive for vomiting.    Patient Active Problem List   Diagnosis Date Noted  . Dysmenorrhea 06/05/2015  . Plantar fasciitis 06/05/2015  . Headache, menstrual migraine 06/05/2015  . Endometriosis 02/27/2014    Prior to Admission medications   Medication Sig Start Date End Date Taking? Authorizing Provider  acetaminophen (TYLENOL) 500 MG tablet Take 1,000 mg by mouth daily as needed for headache.   Yes [provider]  aspirin EC 325 MG tablet Take 1 tablet (325 mg total) by mouth daily. 04/09/17  Yes Samara Deist, DPM  calcium carbonate (TUMS - DOSED IN MG ELEMENTAL CALCIUM) 500 MG chewable tablet Chew 1 tablet by mouth daily as needed for indigestion or heartburn.   Yes [provider]  Prenatal Multivit-Min-Fe-FA (PRE-NATAL PO) Take by mouth.   Yes [provider]    Allergies  Allergen Reactions  . Erythromycin Shortness Of Breath and Swelling    Chest pain  . Imitrex [Sumatriptan]     neck muscle tightness    Past Surgical History:  Procedure Laterality Date  . ACHILLES TENDON SURGERY Right 04/09/2017   Procedure:  ACHILLES TENDON REPAIR;  Surgeon: Samara Deist, DPM;  Location: [facility] ORS;  Service: Podiatry;  Laterality: Right;  . HEEL SPUR SURGERY     04/09/2017  . 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 LIPOMA EXCISION Left 10/31/2015   Procedure: EXCISION LIPOMA;  Surgeon: Clayburn Pert, MD;  Location: [facility] ORS;  Service: General;  Laterality: Left;  . OSTECTOMY Right 04/09/2017   Procedure: OSTECTOMY-[place]/Retrocalcaneal;  Surgeon: Samara Deist, DPM;  Location: [facility] ORS;  Service: Podiatry;  Laterality: Right;  . OVARIAN CYST REMOVAL Left 05/02/2009  . OVARIAN CYST REMOVAL Left 09/23/2006    Social History  Substance Use Topics  . Smoking status: Never Smoker  . Smokeless tobacco: Never Used  . Alcohol use 1.2 oz/week    2 Standard drinks or equivalent per week     Comment: rare     Medication list has been reviewed and updated.   Physical Exam  Constitutional: She is oriented to person, place, and time. She appears well-developed. No distress.  HENT:  Head: Normocephalic and atraumatic.  Eyes: Pupils are equal, round, and reactive to light. EOM are normal.  Neck: Normal range of motion. Neck supple. No thyromegaly present.  Cardiovascular: Normal rate, regular rhythm and normal heart sounds.   Pulmonary/Chest: Effort normal and breath sounds normal. No respiratory distress. She has no wheezes.  Musculoskeletal: She exhibits no edema.  Neurological: She is alert and oriented to  person, place, and time.  Skin: Skin is warm and dry. No rash noted.  Psychiatric: She has a normal mood and affect. Her behavior is normal. Thought content normal.  Nursing note and vitals reviewed.   BP 116/70   Pulse 91   Ht 5\' 4"  (1.626 m)   Wt 233 lb (105.7 kg)   LMP 07/17/2017 (Approximate)   SpO2 100%   BMI 39.99 kg/m   Assessment and Plan: 1. Menstrual migraine without status migrainosus, not intractable - almotriptan (AXERT) 12.5 MG tablet; Take 1 tablet  (12.5 mg total) by mouth as needed for migraine. may repeat in 2 hours if needed  Dispense: 12 tablet; Refill: 2   Meds ordered this encounter  Medications  . almotriptan (AXERT) 12.5 MG tablet    Sig: Take 1 tablet (12.5 mg total) by mouth as needed for migraine. may repeat in 2 hours if needed    Dispense:  12 tablet    Refill:  [place], MD [facility]  08/05/2017

## 2017-09-23 ENCOUNTER — Other Ambulatory Visit: Payer: Self-pay | Admitting: Podiatry

## 2017-09-23 DIAGNOSIS — M7660 Achilles tendinitis, unspecified leg: Secondary | ICD-10-CM

## 2017-09-23 DIAGNOSIS — M19071 Primary osteoarthritis, right ankle and foot: Secondary | ICD-10-CM

## 2017-09-27 ENCOUNTER — Ambulatory Visit: Payer: Self-pay | Admitting: Internal Medicine

## 2017-09-27 ENCOUNTER — Other Ambulatory Visit: Payer: Self-pay | Admitting: Internal Medicine

## 2017-09-27 ENCOUNTER — Ambulatory Visit (INDEPENDENT_AMBULATORY_CARE_PROVIDER_SITE_OTHER): Payer: 59 | Admitting: Internal Medicine

## 2017-09-27 ENCOUNTER — Encounter: Payer: Self-pay | Admitting: Internal Medicine

## 2017-09-27 VITALS — BP 138/84 | HR 84 | Ht 68.0 in | Wt 237.0 lb

## 2017-09-27 DIAGNOSIS — Z6836 Body mass index (BMI) 36.0-36.9, adult: Secondary | ICD-10-CM | POA: Diagnosis not present

## 2017-09-27 DIAGNOSIS — R5383 Other fatigue: Secondary | ICD-10-CM | POA: Diagnosis not present

## 2017-09-27 DIAGNOSIS — Z791 Long term (current) use of non-steroidal anti-inflammatories (NSAID): Secondary | ICD-10-CM

## 2017-09-27 DIAGNOSIS — M25512 Pain in left shoulder: Secondary | ICD-10-CM

## 2017-09-27 DIAGNOSIS — G8929 Other chronic pain: Secondary | ICD-10-CM

## 2017-09-27 NOTE — Progress Notes (Signed)
Date:  09/27/2017   Name:  Dawn Cain   DOB:  04/13/1981   MRN:  [id number]   Chief Complaint: Shoulder Pain (Left shoulder- X 1 month. Thought strained after carrying groceries. Still giving problems a month later. ) and BMI Form ([facility] BMI Form- needs Plan. )  Shoulder Pain   The pain is present in the left shoulder. This is a new problem. The current episode started 1 to 4 weeks ago. The problem has been unchanged. The quality of the pain is described as aching. Associated symptoms include a limited range of motion. Pertinent negatives include no fever, numbness or stiffness. She has tried NSAIDS for the symptoms. The treatment provided no relief.   Elevated BMI - unable to exercise much due to foot surgery and pain.  She also cant row due to shoulder pain.  She has joined [employer] and is working on improving her diet.  Fatigue - having trouble losing weight.  Can't exercise.  Having pain still in foot and now in shoulder.  Feels worn out at the end of the day.  Had been taking high dose advil but now using mostly topical agents.  Review of Systems  Constitutional: Positive for fatigue. Negative for chills, fever and unexpected weight change.  Respiratory: Negative for chest tightness and shortness of breath.   Cardiovascular: Negative for chest pain and palpitations.  Musculoskeletal: Positive for arthralgias, gait problem and myalgias. Negative for stiffness.  Neurological: Negative for numbness.    Patient Active Problem List   Diagnosis Date Noted  . Patellar tendonitis 12/15/2016  . Dysmenorrhea 06/05/2015  . Plantar fasciitis 06/05/2015  . Headache, menstrual migraine 06/05/2015  . Endometriosis 02/27/2014    Prior to Admission medications   Medication Sig Start Date End Date Taking? Authorizing Provider  acetaminophen (TYLENOL) 500 MG tablet Take 1,000 mg by mouth daily as needed for headache.   Yes [provider]  almotriptan (AXERT) 12.5 MG  tablet Take 1 tablet (12.5 mg total) by mouth as needed for migraine. may repeat in 2 hours if needed 08/05/17  Yes Glean Hess, MD  aspirin EC 325 MG tablet Take 1 tablet (325 mg total) by mouth daily. 04/09/17  Yes Samara Deist, DPM  calcium carbonate (TUMS - DOSED IN MG ELEMENTAL CALCIUM) 500 MG chewable tablet Chew 1 tablet by mouth daily as needed for indigestion or heartburn.   Yes [provider]  Capsaicin 0.1 % CREA Apply topically.   Yes [provider]  diclofenac sodium (VOLTAREN) 1 % GEL Apply topically. 09/21/17  Yes [provider]  Prenatal Multivit-Min-Fe-FA (PRE-NATAL PO) Take by mouth.   Yes [provider]    Allergies  Allergen Reactions  . Erythromycin Shortness Of Breath and Swelling    Chest pain  . Imitrex [Sumatriptan]     neck muscle tightness    Past Surgical History:  Procedure Laterality Date  . ACHILLES TENDON SURGERY Right 04/09/2017   Procedure: ACHILLES TENDON REPAIR;  Surgeon: Samara Deist, DPM;  Location: [facility] ORS;  Service: Podiatry;  Laterality: Right;  . HEEL SPUR SURGERY  03/2017   04/09/2017  . 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 LIPOMA EXCISION Left 10/31/2015   Procedure: EXCISION LIPOMA;  Surgeon: Clayburn Pert, MD;  Location: [facility] ORS;  Service: General;  Laterality: Left;  . OSTECTOMY Right 04/09/2017   Procedure: OSTECTOMY-[place]/Retrocalcaneal;  Surgeon: Samara Deist, DPM;  Location: [facility] ORS;  Service:  Podiatry;  Laterality: Right;  . OVARIAN CYST REMOVAL Left 05/02/2009  . OVARIAN CYST REMOVAL Left 09/23/2006    Social History  Substance Use Topics  . Smoking status: Never Smoker  . Smokeless tobacco: Never Used  . Alcohol use 1.2 oz/week    2 Standard drinks or equivalent per week     Comment: rare     Medication list has been reviewed and updated.  PHQ 2/9 Scores 08/05/2017  PHQ - 2 Score 0    Physical Exam  Constitutional: She is oriented  to person, place, and time. She appears well-developed. No distress.  HENT:  Head: Normocephalic and atraumatic.  Neck: Normal range of motion. Neck supple. No thyromegaly present.  Cardiovascular: Normal rate, regular rhythm and normal heart sounds.   Pulmonary/Chest: Effort normal and breath sounds normal. No respiratory distress. She has no wheezes.  Musculoskeletal:       Left shoulder: She exhibits decreased range of motion and tenderness.  Neurological: She is alert and oriented to person, place, and time. She has normal strength. No cranial nerve deficit or sensory deficit.  Skin: Skin is warm and dry. No rash noted.  Psychiatric: She has a normal mood and affect. Her behavior is normal. Thought content normal.  Nursing note and vitals reviewed.   BP 138/84   Pulse 84   Ht 5\' 8"  (1.727 m)   Wt 237 lb (107.5 kg)   LMP 09/02/2017   SpO2 100%   BMI 36.04 kg/m   Assessment and Plan: 1. BMI 36.0-36.9,adult Note for [facility] completed Continue [employer] for weight loss  - TSH  2. Chronic left shoulder pain Concern for rotator cuff pathology - Ambulatory referral to Orthopedic Surgery  3. Other fatigue - CBC with Differential/Platelet  4. Long term (current) use of non-steroidal anti-inflammatories (nsaid) - Comprehensive metabolic panel   No orders of the defined types were placed in this encounter.   Partially dictated using Editor, commissioning. Any errors are unintentional.  Halina Maidens, MD [facility]  09/27/2017

## 2017-09-28 LAB — COMPREHENSIVE METABOLIC PANEL
ALBUMIN: 4.8 g/dL (ref 3.5–5.5)
ALK PHOS: 70 IU/L (ref 39–117)
ALT: 14 IU/L (ref 0–32)
AST: 18 IU/L (ref 0–40)
Albumin/Globulin Ratio: 1.5 (ref 1.2–2.2)
BUN / CREAT RATIO: 16 (ref 9–23)
BUN: 12 mg/dL (ref 6–20)
Bilirubin Total: 0.3 mg/dL (ref 0.0–1.2)
CALCIUM: 9.9 mg/dL (ref 8.7–10.2)
CO2: 24 mmol/L (ref 20–29)
CREATININE: 0.76 mg/dL (ref 0.57–1.00)
Chloride: 103 mmol/L (ref 96–106)
GFR, EST AFRICAN AMERICAN: 115 mL/min/{1.73_m2} (ref >59)
GFR, EST NON AFRICAN AMERICAN: 100 mL/min/{1.73_m2} (ref >59)
GLUCOSE: 97 mg/dL (ref 65–99)
Globulin, Total: 3.2 g/dL (ref 1.5–4.5)
Potassium: 4.3 mmol/L (ref 3.5–5.2)
SODIUM: 143 mmol/L (ref 134–144)
TOTAL PROTEIN: 8 g/dL (ref 6.0–8.5)

## 2017-09-28 LAB — CBC WITH DIFFERENTIAL/PLATELET
Basophils Absolute: 0 10*3/uL (ref 0.0–0.2)
Basos: 1 %
EOS (ABSOLUTE): 0.2 10*3/uL (ref 0.0–0.4)
EOS: 2 %
HEMOGLOBIN: 13.5 g/dL (ref 11.1–15.9)
Hematocrit: 39.8 % (ref 34.0–46.6)
IMMATURE GRANS (ABS): 0 10*3/uL (ref 0.0–0.1)
IMMATURE GRANULOCYTES: 0 %
LYMPHS ABS: 1.9 10*3/uL (ref 0.7–3.1)
LYMPHS: 23 %
MCH: 27.6 pg (ref 26.6–33.0)
MCHC: 33.9 g/dL (ref 31.5–35.7)
MCV: 81 fL (ref 79–97)
MONOCYTES: 7 %
Monocytes Absolute: 0.6 10*3/uL (ref 0.1–0.9)
NEUTROS PCT: 67 %
Neutrophils Absolute: 5.6 10*3/uL (ref 1.4–7.0)
Platelets: 249 10*3/uL (ref 150–379)
RBC: 4.89 x10E6/uL (ref 3.77–5.28)
RDW: 14.6 % (ref 12.3–15.4)
WBC: 8.3 10*3/uL (ref 3.4–10.8)

## 2017-09-28 LAB — TSH: TSH: 2.81 u[IU]/mL (ref 0.450–4.500)

## 2017-09-30 ENCOUNTER — Ambulatory Visit: Payer: 59

## 2017-10-06 ENCOUNTER — Ambulatory Visit
Admission: RE | Admit: 2017-10-06 | Discharge: 2017-10-06 | Disposition: A | Discharge status: Home / Self Care | Payer: 59 | Source: Ambulatory Visit | Attending: Podiatry | Admitting: Podiatry

## 2017-10-06 DIAGNOSIS — Z9889 Other specified postprocedural states: Secondary | ICD-10-CM | POA: Diagnosis not present

## 2017-10-06 DIAGNOSIS — M7661 Achilles tendinitis, right leg: Secondary | ICD-10-CM | POA: Insufficient documentation

## 2017-10-06 DIAGNOSIS — X58XXXA Exposure to other specified factors, initial encounter: Secondary | ICD-10-CM | POA: Insufficient documentation

## 2017-10-06 DIAGNOSIS — M7660 Achilles tendinitis, unspecified leg: Secondary | ICD-10-CM

## 2017-10-06 DIAGNOSIS — M19071 Primary osteoarthritis, right ankle and foot: Secondary | ICD-10-CM | POA: Diagnosis present

## 2017-10-06 DIAGNOSIS — S86311A Strain of muscle(s) and tendon(s) of peroneal muscle group at lower leg level, right leg, initial encounter: Secondary | ICD-10-CM | POA: Diagnosis not present

## 2017-10-06 DIAGNOSIS — R600 Localized edema: Secondary | ICD-10-CM | POA: Insufficient documentation

## 2018-03-22 ENCOUNTER — Encounter: Payer: Self-pay | Admitting: Family Medicine

## 2018-03-22 ENCOUNTER — Ambulatory Visit (INDEPENDENT_AMBULATORY_CARE_PROVIDER_SITE_OTHER): Payer: Managed Care, Other (non HMO) | Admitting: Family Medicine

## 2018-03-22 VITALS — BP 130/81 | HR 74 | Wt 238.0 lb

## 2018-03-22 DIAGNOSIS — N809 Endometriosis, unspecified: Secondary | ICD-10-CM

## 2018-03-22 DIAGNOSIS — N764 Abscess of vulva: Secondary | ICD-10-CM | POA: Diagnosis not present

## 2018-03-22 DIAGNOSIS — Z01411 Encounter for gynecological examination (general) (routine) with abnormal findings: Secondary | ICD-10-CM | POA: Diagnosis not present

## 2018-03-22 DIAGNOSIS — N393 Stress incontinence (female) (male): Secondary | ICD-10-CM

## 2018-03-22 DIAGNOSIS — N979 Female infertility, unspecified: Secondary | ICD-10-CM

## 2018-03-22 DIAGNOSIS — L732 Hidradenitis suppurativa: Secondary | ICD-10-CM

## 2018-03-22 NOTE — Progress Notes (Signed)
Subjective:     Dawn Cain is a 37 y.o. G43P0010 female and is here for a comprehensive physical exam. The patient reports problems - lump in vaginal region on right labia. Increasing pain. Has h/o recurrent boils in groin and on breasts. Does not shave. She has long h/o endometriosis and infertility. Has seen REI, and had IUI, but too expensive for her to continue treatment options. Undecided about adoption. She is not at all interested in any forms of contraception. Having regular cycles, though lately they have been q 3 wks. Has some pain with cycles. Reports leaking urine with cough and sneezing and valsalva. Has no had any children yet. Performs Kegel exercizes.  Social History   Socioeconomic History  . Marital status: Married    Spouse name: Not on file  . Number of children: Not on file  . Years of education: Not on file  . Highest education level: Not on file  Occupational History  . Not on file  Social Needs  . Financial resource strain: Not on file  . Food insecurity:    Worry: Not on file    Inability: Not on file  . Transportation needs:    Medical: Not on file    Non-medical: Not on file  Tobacco Use  . Smoking status: Never Smoker  . Smokeless tobacco: Never Used  Substance and Sexual Activity  . Alcohol use: Yes    Alcohol/week: 1.2 oz    Types: 2 Standard drinks or equivalent per week    Comment: rare  . Drug use: No  . Sexual activity: Yes    Partners: Male    Birth control/protection: None    Comment: trying to conceive  Lifestyle  . Physical activity:    Days per week: Not on file    Minutes per session: Not on file  . Stress: Not on file  Relationships  . Social connections:    Talks on phone: Not on file    Gets together: Not on file    Attends religious service: Not on file    Active member of club or organization: Not on file    Attends meetings of clubs or organizations: Not on file    Relationship status: Not on file  . Intimate partner  violence:    Fear of current or ex partner: Not on file    Emotionally abused: Not on file    Physically abused: Not on file    Forced sexual activity: Not on file  Other Topics Concern  . Not on file  Social History Narrative  . Not on file   Health Maintenance  Topic Date Due  . HIV Screening  07/25/1994  . TETANUS/TDAP  12/16/2015  . INFLUENZA VACCINE  09/22/2018 (Originally 07/14/2018)  . PAP SMEAR  02/11/2020    The following portions of the patient's history were reviewed and updated as appropriate: allergies, current medications, past family history, past medical history, past social history, past surgical history and problem list.  Review of Systems Pertinent items noted in HPI and remainder of comprehensive ROS otherwise negative.   Objective:    BP 130/81   Pulse 74   Wt 238 lb (108 kg)   LMP 03/07/2018   BMI 36.19 kg/m  General appearance: alert, cooperative and appears stated age Head: Normocephalic, without obvious abnormality, atraumatic Neck: no adenopathy, supple, symmetrical, trachea midline and thyroid not enlarged, symmetric, no tenderness/mass/nodules Lungs: clear to auscultation bilaterally Breasts: normal appearance, no masses or tenderness Heart:  regular rate and rhythm, S1, S2 normal, no murmur, click, rub or gallop Abdomen: soft, non-tender; bowel sounds normal; no masses,  no organomegaly Pelvic: cervix normal in appearance, no adnexal masses or tenderness, no cervical motion tenderness, uterus normal size, shape, and consistency, vagina normal without discharge and multiple healed boils noted. Enlarged fluctuant mass in right labia minora Extremities: extremities normal, atraumatic, no cyanosis or edema Pulses: 2+ and symmetric Skin: multiple hyperpigmented lesions noted in groin and on breasts, c/w healed recurrent boils Lymph nodes: Cervical, supraclavicular, and axillary nodes normal. Neurologic: Grossly normal   Procedure:  After informed  consent, area on right labia cleaned with betadine.  1.5 cc of Lidocaine with Epinephrine infiltrated.  Scalpel used to make a small incision and blood-tinged pus drained from site.  Some induration left.  Assessment:    GYN female exam.      Plan:      Problem List Items Addressed This Visit      Unprioritized   Endometriosis    Not interested in pursuing treatment for this at this time.      Infertility, female    Does not want referral at this time.      Suppurative hidradenitis    Consider Humira--would need PCP--advised of cost--trial of Dial or other antibacterial soap--handout given      Urinary, incontinence, stress female    Advised continue Kegel's. Usually would not do surgery until childbearing is complete. May want referral to Uro-Gyn.       Other Visit Diagnoses    Encounter for gynecological examination with abnormal finding    -  Primary   Boil of vulva       s/p I and D--use Sitz baths, warm compresses     Gets blood work through her work--works at [employer] PCP  See After Visit Summary for Counseling Recommendations

## 2018-03-22 NOTE — Progress Notes (Signed)
Check bump on labia x 1-2 weeks

## 2018-03-22 NOTE — Patient Instructions (Addendum)
Preventive Care 18-39 Years, Female Preventive care refers to lifestyle choices and visits with your health care provider that can promote health and wellness. What does preventive care include?  A yearly physical exam. This is also called an annual well check.  Dental exams once or twice a year.  Routine eye exams. Ask your health care provider how often you should have your eyes checked.  Personal lifestyle choices, including: ? Daily care of your teeth and gums. ? Regular physical activity. ? Eating a healthy diet. ? Avoiding tobacco and drug use. ? Limiting alcohol use. ? Practicing safe sex. ? Taking vitamin and mineral supplements as recommended by your health care provider. What happens during an annual well check? The services and screenings done by your health care provider during your annual well check will depend on your age, overall health, lifestyle risk factors, and family history of disease. Counseling Your health care provider may ask you questions about your:  Alcohol use.  Tobacco use.  Drug use.  Emotional well-being.  Home and relationship well-being.  Sexual activity.  Eating habits.  Work and work Statistician.  Method of birth control.  Menstrual cycle.  Pregnancy history.  Screening You may have the following tests or measurements:  Height, weight, and BMI.  Diabetes screening. This is done by checking your blood sugar (glucose) after you have not eaten for a while (fasting).  Blood pressure.  Lipid and cholesterol levels. These may be checked every 5 years starting at age 38.  Skin check.  Hepatitis C blood test.  Hepatitis B blood test.  Sexually transmitted disease (STD) testing.  BRCA-related cancer screening. This may be done if you have a family history of breast, ovarian, tubal, or peritoneal cancers.  Pelvic exam and Pap test. This may be done every 3 years starting at age 38. Starting at age 30, this may be done  every 5 years if you have a Pap test in combination with an HPV test.  Discuss your test results, treatment options, and if necessary, the need for more tests with your health care provider. Vaccines Your health care provider may recommend certain vaccines, such as:  Influenza vaccine. This is recommended every year.  Tetanus, diphtheria, and acellular pertussis (Tdap, Td) vaccine. You may need a Td booster every 10 years.  Varicella vaccine. You may need this if you have not been vaccinated.  HPV vaccine. If you are 39 or younger, you may need three doses over 6 months.  Measles, mumps, and rubella (MMR) vaccine. You may need at least one dose of MMR. You may also need a second dose.  Pneumococcal 13-valent conjugate (PCV13) vaccine. You may need this if you have certain conditions and were not previously vaccinated.  Pneumococcal polysaccharide (PPSV23) vaccine. You may need one or two doses if you smoke cigarettes or if you have certain conditions.  Meningococcal vaccine. One dose is recommended if you are age 68-21 years and a first-year college student living in a residence hall, or if you have one of several medical conditions. You may also need additional booster doses.  Hepatitis A vaccine. You may need this if you have certain conditions or if you travel or work in places where you may be exposed to hepatitis A.  Hepatitis B vaccine. You may need this if you have certain conditions or if you travel or work in places where you may be exposed to hepatitis B.  Haemophilus influenzae type b (Hib) vaccine. You may need this  if you have certain risk factors.  Talk to your health care provider about which screenings and vaccines you need and how often you need them. This information is not intended to replace advice given to you by your health care provider. Make sure you discuss any questions you have with your health care provider. Document Released: 01/26/2002 Document Revised:  08/19/2016 Document Reviewed: 10/01/2015 Elsevier Interactive Patient Education  2018 [place]. Hidradenitis Suppurativa Hidradenitis suppurativa is a long-term (chronic) skin disease that starts with blocked sweat glands or hair follicles. Bacteria may grow in these blocked openings of your skin. Hidradenitis suppurativa is like a severe form of acne that develops in areas of your body where acne would be unusual. It is most likely to affect the areas of your body where skin rubs against skin and becomes moist. This includes your:  Underarms.  Groin.  Genital areas.  Buttocks.  Upper thighs.  Breasts.  Hidradenitis suppurativa may start out with small pimples. The pimples can develop into deep sores that break open (rupture) and drain pus. Over time your skin may thicken and become scarred. Hidradenitis suppurativa cannot be passed from person to person. What are the causes? The exact cause of hidradenitis suppurativa is not known. This condition may be due to:  Female and female hormones. The condition is rare before and after puberty.  An overactive body defense system (immune system). Your immune system may overreact to the blocked hair follicles or sweat glands and cause swelling and pus-filled sores.  What increases the risk? You may have a higher risk of hidradenitis suppurativa if you:  Are a woman.  Are between ages 26 and 7.  Have a family history of hidradenitis suppurativa.  Have a personal history of acne.  Are overweight.  Smoke.  Take the drug lithium.  What are the signs or symptoms? The first signs of an outbreak are usually painful skin bumps that look like pimples. As the condition progresses:  Skin bumps may get bigger and grow deeper into the skin.  Bumps under the skin may rupture and drain smelly pus.  Skin may become itchy and infected.  Skin may thicken and scar.  Drainage may continue through tunnels under the skin  (fistulas).  Walking and moving your arms can become painful.  How is this diagnosed? Your health care provider may diagnose hidradenitis suppurativa based on your medical history and your signs and symptoms. A physical exam will also be done. You may need to see a health care provider who specializes in skin diseases (dermatologist). You may also have tests done to confirm the diagnosis. These can include:  Swabbing a sample of pus or drainage from your skin so it can be sent to the lab and tested for infection.  Blood tests to check for infection.  How is this treated? The same treatment will not work for everybody with hidradenitis suppurativa. Your treatment will depend on how severe your symptoms are. You may need to try several treatments to find what works best for you. Part of your treatment may include cleaning and bandaging (dressing) your wounds. You may also have to take medicines, such as the following:  Antibiotics.  Acne medicines.  Medicines to block or suppress the immune system.  A diabetes medicine (metformin) is sometimes used to treat this condition.  For women, birth control pills can sometimes help relieve symptoms.  You may need surgery if you have a severe case of hidradenitis suppurativa that does not respond to  medicine. Surgery may involve:  Using a laser to clear the skin and remove hair follicles.  Opening and draining deep sores.  Removing the areas of skin that are diseased and scarred.  Follow these instructions at home:  Learn as much as you can about your disease, and work closely with your health care providers.  Take medicines only as directed by your health care provider.  If you were prescribed an antibiotic medicine, finish it all even if you start to feel better.  If you are overweight, losing weight may be very helpful. Try to reach and maintain a healthy weight.  Do not use any tobacco products, including cigarettes, chewing  tobacco, or electronic cigarettes. If you need help quitting, ask your health care provider.  Do not shave the areas where you get hidradenitis suppurativa.  Do not wear deodorant.  Wear loose-fitting clothes.  Try not to overheat and get sweaty.  Take a daily bleach bath as directed by your health care provider. ? Fill your bathtub halfway with water. ? Pour in  cup of unscented household bleach. ? Soak for 5-10 minutes.  Cover sore areas with a warm, clean washcloth (compress) for 5-10 minutes. Contact a health care provider if:  You have a flare-up of hidradenitis suppurativa.  You have chills or a fever.  You are having trouble controlling your symptoms at home. This information is not intended to replace advice given to you by your health care provider. Make sure you discuss any questions you have with your health care provider. Document Released: 07/14/2004 Document Revised: 05/07/2016 Document Reviewed: 03/02/2014 Elsevier Interactive Patient Education  2018 [employer].

## 2018-03-23 DIAGNOSIS — N393 Stress incontinence (female) (male): Secondary | ICD-10-CM | POA: Insufficient documentation

## 2018-03-23 DIAGNOSIS — L732 Hidradenitis suppurativa: Secondary | ICD-10-CM | POA: Insufficient documentation

## 2018-03-23 DIAGNOSIS — N979 Female infertility, unspecified: Secondary | ICD-10-CM | POA: Insufficient documentation

## 2018-03-23 NOTE — Assessment & Plan Note (Signed)
Not interested in pursuing treatment for this at this time.

## 2018-03-23 NOTE — Assessment & Plan Note (Signed)
Advised continue Kegel's. Usually would not do surgery until childbearing is complete. May want referral to Uro-Gyn.

## 2018-03-23 NOTE — Assessment & Plan Note (Signed)
Consider Humira--would need PCP--advised of cost--trial of Dial or other antibacterial soap--handout given

## 2018-03-23 NOTE — Assessment & Plan Note (Signed)
Does not want referral at this time.

## 2018-04-15 ENCOUNTER — Ambulatory Visit: Payer: Managed Care, Other (non HMO) | Admitting: Internal Medicine

## 2018-04-15 ENCOUNTER — Encounter: Payer: Self-pay | Admitting: Internal Medicine

## 2018-04-15 ENCOUNTER — Other Ambulatory Visit: Payer: Self-pay

## 2018-04-15 VITALS — BP 122/82 | HR 90 | Temp 98.7°F | Resp 16 | Ht 68.0 in | Wt 237.0 lb

## 2018-04-15 DIAGNOSIS — J329 Chronic sinusitis, unspecified: Secondary | ICD-10-CM

## 2018-04-15 DIAGNOSIS — J0100 Acute maxillary sinusitis, unspecified: Secondary | ICD-10-CM | POA: Diagnosis not present

## 2018-04-15 NOTE — Progress Notes (Signed)
Date:  04/15/2018   Name:  Dawn Cain   DOB:  1981/05/20   MRN:  [id number]   Chief Complaint: Sinusitis (fever on Monday started Augmentin that was an old RX given month ago when she was seen for sinus congestion/cough which got better so she never filled Rx. She has taken this since Monday and has ST, Post ND and some cough/tightness in chest. ) Sinusitis  This is a new problem. The current episode started in the past 7 days. The problem is unchanged. There has been no fever. The pain is mild. Associated symptoms include congestion and coughing. Pertinent negatives include no chills or shortness of breath. Past treatments include oral decongestants and spray decongestants.  She is taking augmentin that she started 4 days ago.  She is taking flonase and sudafed. She has recurrent infections - probably 3 in the past year.  She has sinus congestion more or less chronically. She is wondering about allergy testing.    Review of Systems  Constitutional: Positive for fever. Negative for chills and fatigue.  HENT: Positive for congestion, postnasal drip and voice change. Negative for trouble swallowing.   Respiratory: Positive for cough. Negative for chest tightness, shortness of breath and wheezing.   Cardiovascular: Negative for chest pain and palpitations.    Patient Active Problem List   Diagnosis Date Noted  . Infertility, female 03/23/2018  . 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 Urinary, incontinence, stress female 03/23/2018  . Patellar tendonitis 12/15/2016  . Dysmenorrhea 06/05/2015  . Plantar fasciitis 06/05/2015  . Headache, menstrual migraine 06/05/2015  . Endometriosis 02/27/2014    Prior to Admission medications   Medication Sig Start Date End Date Taking? Authorizing Provider  acetaminophen (TYLENOL) 500 MG tablet Take 1,000 mg by mouth daily as needed for headache.   Yes [provider]  almotriptan (AXERT) 12.5 MG tablet Take 1 tablet (12.5 mg total) by  mouth as needed for migraine. may repeat in 2 hours if needed 08/05/17  Yes Glean Hess, MD  aspirin EC 325 MG tablet Take 1 tablet (325 mg total) by mouth daily. 04/09/17  Yes Samara Deist, DPM  calcium carbonate (TUMS - DOSED IN MG ELEMENTAL CALCIUM) 500 MG chewable tablet Chew 1 tablet by mouth daily as needed for indigestion or heartburn.   Yes [provider]  Capsaicin 0.1 % CREA Apply topically.   Yes [provider]  diclofenac sodium (VOLTAREN) 1 % GEL Apply topically. 09/21/17  Yes [provider]  Prenatal Multivit-Min-Fe-FA (PRE-NATAL PO) Take by mouth.   Yes [provider]    Allergies  Allergen Reactions  . Erythromycin Shortness Of Breath and Swelling    Chest pain  . Imitrex [Sumatriptan]     neck muscle tightness    Past Surgical History:  Procedure Laterality Date  . ACHILLES TENDON SURGERY Right 04/09/2017   Procedure: ACHILLES TENDON REPAIR;  Surgeon: Samara Deist, DPM;  Location: [facility] OR[facility];  Service: Podiatry;  Laterality: Right;  . HEEL SPUR SURGERY  03/2017   04/09/2017  . 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 LIPOMA EXCISION Left 10/31/2015   Procedure: EXCISION LIPOMA;  Surgeon: Clayburn Pert, MD;  Location: [facility] ORS;  Service: General;  Laterality: Left;  . OSTECTOMY Right 04/09/2017   Procedure: OSTECTOMY-[place]/Retrocalcaneal;  Surgeon: Samara Deist, DPM;  Location: [facility] OR[facility];  Service: Podiatry;  Laterality: Right;  . OVARIAN CYST REMOVAL Left 05/02/2009  . OVARIAN CYST REMOVAL Left  09/23/2006    Social History   Tobacco Use  . Smoking status: Never Smoker  . Smokeless tobacco: Never Used  Substance Use Topics  . Alcohol use: Yes    Alcohol/week: 1.2 oz    Types: 2 Standard drinks or equivalent per week    Comment: rare  . Drug use: No     Medication list has been reviewed and updated.  PHQ 2/9 Scores 08/05/2017  PHQ - 2 Score 0    Physical Exam  Constitutional:  She is oriented to person, place, and time. She appears well-developed and well-nourished.  HENT:  Right Ear: External ear and ear canal normal. Tympanic membrane is not erythematous and not retracted.  Left Ear: External ear and ear canal normal. Tympanic membrane is not erythematous and not retracted.  Nose: Right sinus exhibits maxillary sinus tenderness. Right sinus exhibits no frontal sinus tenderness. Left sinus exhibits maxillary sinus tenderness. Left sinus exhibits no frontal sinus tenderness.  Mouth/Throat: Uvula is midline and mucous membranes are normal. No oral lesions. Posterior oropharyngeal erythema present. No oropharyngeal exudate.  Neck: Normal range of motion. Neck supple.  Cardiovascular: Normal rate, regular rhythm and normal heart sounds.  Pulmonary/Chest: Breath sounds normal. No respiratory distress. She has no wheezes. She has no rales.  Lymphadenopathy:    She has no cervical adenopathy.  Neurological: She is alert and oriented to person, place, and time.    BP 122/82   Pulse 90   Temp 98.7 F (37.1 C)   Resp 16   Ht 5\' 8"  (1.727 m)   Wt 237 lb (107.5 kg)   LMP 04/01/2018   SpO2 98%   BMI 36.04 kg/m   Assessment and Plan: 1. Acute non-recurrent maxillary sinusitis Finish augmentin Continue flonase, sudafed Consider ENT evaluation    No orders of the defined types were placed in this encounter.   Partially dictated using Editor, commissioning. Any errors are unintentional.  Halina Maidens, MD [facility]  04/15/2018

## 2018-07-27 ENCOUNTER — Encounter: Payer: Self-pay | Admitting: Internal Medicine

## 2018-07-27 ENCOUNTER — Ambulatory Visit: Payer: Managed Care, Other (non HMO) | Admitting: Internal Medicine

## 2018-07-27 VITALS — BP 124/70 | HR 67 | Temp 99.1°F | Ht 68.0 in | Wt 236.0 lb

## 2018-07-27 DIAGNOSIS — G43829 Menstrual migraine, not intractable, without status migrainosus: Secondary | ICD-10-CM | POA: Diagnosis not present

## 2018-07-27 DIAGNOSIS — R509 Fever, unspecified: Secondary | ICD-10-CM | POA: Diagnosis not present

## 2018-07-27 MED ORDER — DOXYCYCLINE HYCLATE 100 MG PO TABS: 100.0000 mg | ORAL_TABLET | Freq: Two times a day (BID) | ORAL | 0 refills | Status: AC

## 2018-07-27 MED ORDER — ALMOTRIPTAN MALATE 12.5 MG PO TABS: 12.5000 mg | ORAL_TABLET | ORAL | 5 refills | Status: DC | PRN

## 2018-07-27 NOTE — Progress Notes (Signed)
Date:  07/27/2018   Name:  Dawn Cain   DOB:  1981-10-06   MRN:  [id number]   Chief Complaint: Headache (Started Friday with headache. fever and chills started Saturday night. Trying OTC meds not helping. Headache is everyday and last night was the first night she did not have fever and chills.  )  Headache   This is a recurrent problem. The problem occurs constantly. Pain location: general. The pain quality is similar to prior headaches. The pain is moderate. Associated symptoms include a fever and muscle aches. Pertinent negatives include no coughing or sore throat. The treatment provided no relief. Her past medical history is significant for migraine headaches.     Review of Systems  Constitutional: Positive for chills, fatigue and fever. Negative for unexpected weight change.  HENT: Negative for sore throat and trouble swallowing.   Respiratory: Negative for cough, choking, chest tightness and shortness of breath.   Cardiovascular: Negative for chest pain and palpitations.  Musculoskeletal: Positive for myalgias. Negative for gait problem and joint swelling.  Skin: Negative for color change and rash.  Neurological: Positive for headaches.  Psychiatric/Behavioral: Negative for sleep disturbance.    Patient Active Problem List   Diagnosis Date Noted  . Infertility, female 03/23/2018  . 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 Urinary, incontinence, stress female 03/23/2018  . Patellar tendonitis 12/15/2016  . Dysmenorrhea 06/05/2015  . Plantar fasciitis 06/05/2015  . Headache, menstrual migraine 06/05/2015  . Endometriosis 02/27/2014    Allergies  Allergen Reactions  . Erythromycin Shortness Of Breath and Swelling    Chest pain  . Imitrex [Sumatriptan]     neck muscle tightness    Past Surgical History:  Procedure Laterality Date  . ACHILLES TENDON SURGERY Right 04/09/2017   Procedure: ACHILLES TENDON REPAIR;  Surgeon: Samara Deist, DPM;  Location: [facility] OR[facility];   Service: Podiatry;  Laterality: Right;  . HEEL SPUR SURGERY  03/2017   04/09/2017  . 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 LIPOMA EXCISION Left 10/31/2015   Procedure: EXCISION LIPOMA;  Surgeon: Clayburn Pert, MD;  Location: [facility] ORS;  Service: General;  Laterality: Left;  . OSTECTOMY Right 04/09/2017   Procedure: OSTECTOMY-[place]/Retrocalcaneal;  Surgeon: Samara Deist, DPM;  Location: [facility] OR[facility];  Service: Podiatry;  Laterality: Right;  . OVARIAN CYST REMOVAL Left 05/02/2009  . OVARIAN CYST REMOVAL Left 09/23/2006    Social History   Tobacco Use  . Smoking status: Never Smoker  . Smokeless tobacco: Never Used  Substance Use Topics  . Alcohol use: Yes    Alcohol/week: 2.0 standard drinks    Types: 2 Standard drinks or equivalent per week    Comment: rare  . Drug use: No     Medication list has been reviewed and updated.  Current Meds  Medication Sig  . acetaminophen (TYLENOL) 500 MG tablet Take 1,000 mg by mouth daily as needed for headache.  . almotriptan (AXERT) 12.5 MG tablet Take 1 tablet (12.5 mg total) by mouth as needed for migraine. may repeat in 2 hours if needed  . calcium carbonate (TUMS - DOSED IN MG ELEMENTAL CALCIUM) 500 MG chewable tablet Chew 1 tablet by mouth daily as needed for indigestion or heartburn.  . Phenylephrine-APAP-guaiFENesin (MUCINEX SINUS-MAX PO) Take by mouth.  . Prenatal Multivit-Min-Fe-FA (PRE-NATAL PO) Take by mouth.    PHQ 2/9 Scores 08/05/2017  PHQ - 2 Score 0    Physical Exam  Constitutional: She is oriented to  person, place, and time. She appears well-developed and well-nourished. No distress.  HENT:  Head: Normocephalic and atraumatic.  Right Ear: Tympanic membrane and ear canal normal.  Left Ear: Tympanic membrane and ear canal normal.  Nose: Right sinus exhibits no maxillary sinus tenderness and no frontal sinus tenderness. Left sinus exhibits no maxillary sinus tenderness and no frontal sinus  tenderness.  Mouth/Throat: Mucous membranes are normal. Posterior oropharyngeal erythema present.  Eyes: Pupils are equal, round, and reactive to light. EOM are normal.  Neck: Normal range of motion.  Pulmonary/Chest: Effort normal. No respiratory distress.  Musculoskeletal: Normal range of motion.  Neurological: She is alert and oriented to person, place, and time.  Skin: Skin is warm and dry. No rash noted.  Psychiatric: She has a normal mood and affect. Her behavior is normal. Thought content normal.  Nursing note and vitals reviewed.   BP 124/70   Pulse 67   Temp 99.1 F (37.3 C) (Oral)   Ht 5\' 8"  (1.727 m)   Wt 236 lb (107 kg)   SpO2 100%   BMI 35.88 kg/m   Assessment and Plan: 1. Fever and chills Continue fluids, tylenol tid Doxy for resumed tick borne illness - doxycycline (VIBRA-TABS) 100 MG tablet; Take 1 tablet (100 mg total) by mouth 2 (two) times daily for 10 days.  Dispense: 20 tablet; Refill: 0  2. Menstrual migraine without status migrainosus, not intractable - almotriptan (AXERT) 12.5 MG tablet; Take 1 tablet (12.5 mg total) by mouth as needed for migraine. may repeat in 2 hours if needed  Dispense: 12 tablet; Refill: 5   Meds ordered this encounter  Medications  . doxycycline (VIBRA-TABS) 100 MG tablet    Sig: Take 1 tablet (100 mg total) by mouth 2 (two) times daily for 10 days.    Dispense:  20 tablet    Refill:  0  . almotriptan (AXERT) 12.5 MG tablet    Sig: Take 1 tablet (12.5 mg total) by mouth as needed for migraine. may repeat in 2 hours if needed    Dispense:  12 tablet    Refill:  5    Partially dictated using Editor, commissioning. Any errors are unintentional.  Halina Maidens, MD [facility]  07/27/2018

## 2018-10-03 ENCOUNTER — Ambulatory Visit: Payer: Managed Care, Other (non HMO) | Admitting: Internal Medicine

## 2018-10-03 ENCOUNTER — Encounter: Payer: Self-pay | Admitting: Internal Medicine

## 2018-10-03 VITALS — BP 118/72 | HR 90 | Ht 68.0 in | Wt 235.0 lb

## 2018-10-03 DIAGNOSIS — Z6835 Body mass index (BMI) 35.0-35.9, adult: Secondary | ICD-10-CM

## 2018-10-03 DIAGNOSIS — M722 Plantar fascial fibromatosis: Secondary | ICD-10-CM | POA: Diagnosis not present

## 2018-10-03 DIAGNOSIS — G43829 Menstrual migraine, not intractable, without status migrainosus: Secondary | ICD-10-CM | POA: Diagnosis not present

## 2018-10-03 NOTE — Progress Notes (Signed)
Date:  10/03/2018   Name:  Dawn Cain   DOB:  1981-05-11   MRN:  [id number]   Chief Complaint: Weight Gain (Needs to discuss weight loss goals for work. BMI form.)  Migraine   This is a recurrent problem. The problem occurs intermittently. The pain quality is similar to prior headaches. Pertinent negatives include no abdominal pain, back pain, dizziness or fever. She has tried triptans for the symptoms. The treatment provided significant relief.  Foot Injury   There was no injury mechanism. The quality of the pain is described as burning and aching (plantar faciitis). The pain has been improving since onset.  BMI >35 - needs to discuss weight management options to avoid insurance penalty from [employer].  She reports that her faciitis is improved and she has been able to do more walking with the dogs.  She is trying to cut back on carbs in her diet, her downfall are coffee drinks.  This past year her weight has been fluctuating some.  She hopes to continues her current program and make steady progress.   Review of Systems  Constitutional: Negative for chills, fatigue, fever and unexpected weight change.  Respiratory: Negative for chest tightness, shortness of breath and wheezing.   Cardiovascular: Negative for chest pain and palpitations.  Gastrointestinal: Negative for abdominal pain, constipation and diarrhea.  Musculoskeletal: Negative for arthralgias, back pain and gait problem.  Allergic/Immunologic: Positive for environmental allergies.  Neurological: Positive for headaches. Negative for dizziness.  Hematological: Negative for adenopathy.  Psychiatric/Behavioral: Negative for dysphoric mood. The patient is not nervous/anxious.     Patient Active Problem List   Diagnosis Date Noted  . Infertility, female 03/23/2018  . 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 Urinary, incontinence, stress female 03/23/2018  . Patellar tendonitis 12/15/2016  . Dysmenorrhea 06/05/2015  . Plantar  fasciitis 06/05/2015  . Headache, menstrual migraine 06/05/2015  . Endometriosis 02/27/2014    Allergies  Allergen Reactions  . Erythromycin Shortness Of Breath and Swelling    Chest pain  . Imitrex [Sumatriptan]     neck muscle tightness    Past Surgical History:  Procedure Laterality Date  . ACHILLES TENDON SURGERY Right 04/09/2017   Procedure: ACHILLES TENDON REPAIR;  Surgeon: Samara Deist, DPM;  Location: [facility];  Service: Podiatry;  Laterality: Right;  . HEEL SPUR SURGERY  03/2017   04/09/2017  . 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 LIPOMA EXCISION Left 10/31/2015   Procedure: EXCISION LIPOMA;  Surgeon: Clayburn Pert, MD;  Location: [facility];  Service: General;  Laterality: Left;  . OSTECTOMY Right 04/09/2017   Procedure: OSTECTOMY-[place]/Retrocalcaneal;  Surgeon: Samara Deist, DPM;  Location: [facility];  Service: Podiatry;  Laterality: Right;  . OVARIAN CYST REMOVAL Left 05/02/2009  . OVARIAN CYST REMOVAL Left 09/23/2006    Social History   Tobacco Use  . Smoking status: Never Smoker  . Smokeless tobacco: Never Used  Substance Use Topics  . Alcohol use: Yes    Alcohol/week: 2.0 standard drinks    Types: 2 Standard drinks or equivalent per week    Comment: rare  . Drug use: No     Medication list has been reviewed and updated.  Current Meds  Medication Sig  . acetaminophen (TYLENOL) 500 MG tablet Take 1,000 mg by mouth daily as needed for headache.  . almotriptan (AXERT) 12.5 MG tablet Take 1 tablet (12.5 mg total) by mouth as needed for migraine. may repeat in 2  hours if needed  . calcium carbonate (TUMS - DOSED IN MG ELEMENTAL CALCIUM) 500 MG chewable tablet Chew 1 tablet by mouth daily as needed for indigestion or heartburn.  . Prenatal Multivit-Min-Fe-FA (PRE-NATAL PO) Take by mouth.    PHQ 2/9 Scores 10/03/2018 08/05/2017  PHQ - 2 Score 0 0    Physical Exam  Constitutional: She is oriented to person, place, and  time. She appears well-developed. No distress.  HENT:  Head: Normocephalic and atraumatic.  Neck: Normal range of motion. Neck supple.  Cardiovascular: Normal rate, regular rhythm and normal heart sounds.  Pulmonary/Chest: Effort normal and breath sounds normal. No respiratory distress.  Musculoskeletal: She exhibits no edema.  Neurological: She is alert and oriented to person, place, and time.  Skin: Skin is warm and dry. No rash noted.  Psychiatric: She has a normal mood and affect. Her behavior is normal. Thought content normal.  Nursing note and vitals reviewed.   BP 118/72   Pulse 90   Ht 5\' 8"  (1.727 m)   Wt 235 lb (106.6 kg)   SpO2 100%   BMI 35.73 kg/m   Assessment and Plan: 1. Menstrual migraine without status migrainosus, not intractable Stable, intermittent Continue Axert  2. BMI 35.0-35.9,adult Continue regular exercise 30-45 min 5 days per week Limit carbs and sweet drinks Form for [facility] completed  3. Plantar fasciitis improved   Partially dictated using Editor, commissioning. Any errors are unintentional.  Halina Maidens, MD [facility]  10/03/2018

## 2018-10-07 ENCOUNTER — Encounter: Payer: Self-pay | Admitting: Internal Medicine

## 2018-10-07 ENCOUNTER — Ambulatory Visit: Payer: Managed Care, Other (non HMO) | Admitting: Internal Medicine

## 2018-10-07 VITALS — BP 128/74 | HR 85 | Temp 98.9°F | Ht 68.0 in | Wt 235.0 lb

## 2018-10-07 DIAGNOSIS — M62830 Muscle spasm of back: Secondary | ICD-10-CM

## 2018-10-07 MED ORDER — METHOCARBAMOL 500 MG PO TABS: 500.0000 mg | ORAL_TABLET | Freq: Four times a day (QID) | ORAL | 0 refills | Status: DC

## 2018-10-07 NOTE — Patient Instructions (Signed)
Take Aleve 2 tabs twice a day. Use heat

## 2018-10-07 NOTE — Progress Notes (Signed)
Date:  10/07/2018   Name:  Dawn Cain   DOB:  [birth year]   MRN:  [id number]   Chief Complaint: Back Pain (Right lower back pain. Tightness- mainly at night. Radiates down into hip join. Thinks she "slept wrong." )  Back Pain  This is a new problem. The current episode started in the past 7 days. The problem is unchanged. The pain is present in the lumbar spine. The pain radiates to the right thigh. The pain is mild. The pain is worse during the night. Pertinent negatives include no bladder incontinence, bowel incontinence, dysuria, numbness, perianal numbness, weakness or weight loss.  She went to [facility] - UA showed some blood so they suspected a kidney stone, gave her flomax and got a Tramadol injection. She has not taken the flomax. She has no hx of renal stone. She denies urinary tract sx.  Review of Systems  Constitutional: Negative for chills, fatigue and weight loss.  Gastrointestinal: Negative for bowel incontinence.  Genitourinary: Positive for menstrual problem (mild vaginal spotting). Negative for bladder incontinence, difficulty urinating, dysuria and hematuria.  Musculoskeletal: Positive for back pain (worse with sitting and twisting). Negative for gait problem and joint swelling.  Neurological: Negative for weakness and numbness.    Patient Active Problem List   Diagnosis Date Noted  . Infertility, female 03/23/2018  . 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 Urinary, incontinence, stress female 03/23/2018  . Patellar tendonitis 12/15/2016  . Dysmenorrhea 06/05/2015  . Plantar fasciitis 06/05/2015  . Headache, menstrual migraine 06/05/2015  . Endometriosis 02/27/2014    Allergies  Allergen Reactions  . Erythromycin Shortness Of Breath and Swelling    Chest pain  . Imitrex [Sumatriptan]     neck muscle tightness    Past Surgical History:  Procedure Laterality Date  . ACHILLES TENDON SURGERY Right 04/09/2017   Procedure: ACHILLES TENDON REPAIR;  Surgeon:  Samara Deist, DPM;  Location: [facility];  Service: Podiatry;  Laterality: Right;  . HEEL SPUR SURGERY  03/2017   04/09/2017  . 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 LIPOMA EXCISION Left 10/31/2015   Procedure: EXCISION LIPOMA;  Surgeon: Clayburn Pert, MD;  Location: [facility] OR[facility];  Service: General;  Laterality: Left;  . OSTECTOMY Right 04/09/2017   Procedure: OSTECTOMY-[place]/Retrocalcaneal;  Surgeon: Samara Deist, DPM;  Location: [facility];  Service: Podiatry;  Laterality: Right;  . OVARIAN CYST REMOVAL Left 05/02/2009  . OVARIAN CYST REMOVAL Left 09/23/2006    Social History   Tobacco Use  . Smoking status: Never Smoker  . Smokeless tobacco: Never Used  Substance Use Topics  . Alcohol use: Yes    Alcohol/week: 2.0 standard drinks    Types: 2 Standard drinks or equivalent per week    Comment: rare  . Drug use: No     Medication list has been reviewed and updated.  Current Meds  Medication Sig  . acetaminophen (TYLENOL) 500 MG tablet Take 1,000 mg by mouth daily as needed for headache.  . almotriptan (AXERT) 12.5 MG tablet Take 1 tablet (12.5 mg total) by mouth as needed for migraine. may repeat in 2 hours if needed  . calcium carbonate (TUMS - DOSED IN MG ELEMENTAL CALCIUM) 500 MG chewable tablet Chew 1 tablet by mouth daily as needed for indigestion or heartburn.  . Prenatal Multivit-Min-Fe-FA (PRE-NATAL PO) Take by mouth.    PHQ 2/9 Scores 10/03/2018 08/05/2017  PHQ - 2 Score 0 0    Physical Exam  Constitutional: She is oriented to person, place, and time. She appears well-developed. No distress.  HENT:  Head: Normocephalic and atraumatic.  Pulmonary/Chest: Effort normal. No respiratory distress.  Musculoskeletal:       Lumbar back: She exhibits decreased range of motion. She exhibits no tenderness and no bony tenderness.  Neurological: She is alert and oriented to person, place, and time.  Reflex Scores:      Patellar reflexes are 2+  on the right side and 2+ on the left side. Skin: Skin is warm and dry. No rash noted.  Psychiatric: She has a normal mood and affect. Her behavior is normal. Thought content normal.  Nursing note and vitals reviewed.   BP 128/74 (BP Location: Right Arm, Patient Position: Sitting, Cuff Size: Large)   Pulse 85   Temp 98.9 F (37.2 C) (Oral)   Ht 5\' 8"  (1.727 m)   Wt 235 lb (106.6 kg)   LMP 09/22/2018 (Exact Date)   SpO2 100%   BMI 35.73 kg/m   Assessment and Plan: 1. Spasm of muscle of lower back Continue Aleve 2 tabs bid Use heat Do not take Flomax - methocarbamol (ROBAXIN) 500 MG tablet; Take 1 tablet (500 mg total) by mouth 4 (four) times daily.  Dispense: 60 tablet; Refill: 0   Partially dictated using Editor, commissioning. Any errors are unintentional.  Halina Maidens, MD [facility]  10/07/2018

## 2018-10-10 ENCOUNTER — Ambulatory Visit: Payer: Managed Care, Other (non HMO) | Admitting: Internal Medicine

## 2019-02-01 ENCOUNTER — Encounter: Payer: Self-pay | Admitting: Radiology

## 2019-07-20 ENCOUNTER — Ambulatory Visit: Payer: Managed Care, Other (non HMO) | Admitting: Family Medicine

## 2019-09-12 ENCOUNTER — Other Ambulatory Visit: Payer: Self-pay

## 2019-09-12 ENCOUNTER — Ambulatory Visit (INDEPENDENT_AMBULATORY_CARE_PROVIDER_SITE_OTHER): Payer: Managed Care, Other (non HMO) | Admitting: Family Medicine

## 2019-09-12 ENCOUNTER — Encounter: Payer: Self-pay | Admitting: Family Medicine

## 2019-09-12 VITALS — BP 122/71 | HR 79 | Ht 64.0 in | Wt 227.2 lb

## 2019-09-12 DIAGNOSIS — Z01419 Encounter for gynecological examination (general) (routine) without abnormal findings: Secondary | ICD-10-CM

## 2019-09-12 DIAGNOSIS — N979 Female infertility, unspecified: Secondary | ICD-10-CM

## 2019-09-12 DIAGNOSIS — N920 Excessive and frequent menstruation with regular cycle: Secondary | ICD-10-CM

## 2019-09-12 DIAGNOSIS — N809 Endometriosis, unspecified: Secondary | ICD-10-CM

## 2019-09-12 DIAGNOSIS — Z124 Encounter for screening for malignant neoplasm of cervix: Secondary | ICD-10-CM

## 2019-09-12 NOTE — Assessment & Plan Note (Signed)
Discussed treatment options including Aygestin, Freida Busman as she is uninterested in contraception. She will consider--this may be making nausea worse.

## 2019-09-12 NOTE — Patient Instructions (Signed)
 Preventive Care 21-39 Years Old, Female Preventive care refers to visits with your health care provider and lifestyle choices that can promote health and wellness. This includes:  A yearly physical exam. This may also be called an annual well check.  Regular dental visits and eye exams.  Immunizations.  Screening for certain conditions.  Healthy lifestyle choices, such as eating a healthy diet, getting regular exercise, not using drugs or products that contain nicotine and tobacco, and limiting alcohol use. What can I expect for my preventive care visit? Physical exam Your health care provider will check your:  Height and weight. This may be used to calculate body mass index (BMI), which tells if you are at a healthy weight.  Heart rate and blood pressure.  Skin for abnormal spots. Counseling Your health care provider may ask you questions about your:  Alcohol, tobacco, and drug use.  Emotional well-being.  Home and relationship well-being.  Sexual activity.  Eating habits.  Work and work environment.  Method of birth control.  Menstrual cycle.  Pregnancy history. What immunizations do I need?  Influenza (flu) vaccine  This is recommended every year. Tetanus, diphtheria, and pertussis (Tdap) vaccine  You may need a Td booster every 10 years. Varicella (chickenpox) vaccine  You may need this if you have not been vaccinated. Human papillomavirus (HPV) vaccine  If recommended by your health care provider, you may need three doses over 6 months. Measles, mumps, and rubella (MMR) vaccine  You may need at least one dose of MMR. You may also need a second dose. Meningococcal conjugate (MenACWY) vaccine  One dose is recommended if you are age 19-21 years and a first-year college student living in a residence hall, or if you have one of several medical conditions. You may also need additional booster doses. Pneumococcal conjugate (PCV13) vaccine  You may need  this if you have certain conditions and were not previously vaccinated. Pneumococcal polysaccharide (PPSV23) vaccine  You may need one or two doses if you smoke cigarettes or if you have certain conditions. Hepatitis A vaccine  You may need this if you have certain conditions or if you travel or work in places where you may be exposed to hepatitis A. Hepatitis B vaccine  You may need this if you have certain conditions or if you travel or work in places where you may be exposed to hepatitis B. Haemophilus influenzae type b (Hib) vaccine  You may need this if you have certain conditions. You may receive vaccines as individual doses or as more than one vaccine together in one shot (combination vaccines). Talk with your health care provider about the risks and benefits of combination vaccines. What tests do I need?  Blood tests  Lipid and cholesterol levels. These may be checked every 5 years starting at age 20.  Hepatitis C test.  Hepatitis B test. Screening  Diabetes screening. This is done by checking your blood sugar (glucose) after you have not eaten for a while (fasting).  Sexually transmitted disease (STD) testing.  BRCA-related cancer screening. This may be done if you have a family history of breast, ovarian, tubal, or peritoneal cancers.  Pelvic exam and Pap test. This may be done every 3 years starting at age 21. Starting at age 30, this may be done every 5 years if you have a Pap test in combination with an HPV test. Talk with your health care provider about your test results, treatment options, and if necessary, the need for more   tests. Follow these instructions at home: Eating and drinking   Eat a diet that includes fresh fruits and vegetables, whole grains, lean protein, and low-fat dairy.  Take vitamin and mineral supplements as recommended by your health care provider.  Do not drink alcohol if: ? Your health care provider tells you not to drink. ? You are  pregnant, may be pregnant, or are planning to become pregnant.  If you drink alcohol: ? Limit how much you have to 0-1 drink a day. ? Be aware of how much alcohol is in your drink. In the [place] one drink equals one 12 oz bottle of beer (355 mL), one 5 oz glass of wine (148 mL), or one 1 oz glass of hard liquor (44 mL). Lifestyle  Take daily care of your teeth and gums.  Stay active. Exercise for at least 30 minutes on 5 or more days each week.  Do not use any products that contain nicotine or tobacco, such as cigarettes, e-cigarettes, and chewing tobacco. If you need help quitting, ask your health care provider.  If you are sexually active, practice safe sex. Use a condom or other form of birth control (contraception) in order to prevent pregnancy and STIs (sexually transmitted infections). If you plan to become pregnant, see your health care provider for a preconception visit. What's next?  Visit your health care provider once a year for a well check visit.  Ask your health care provider how often you should have your eyes and teeth checked.  Stay up to date on all vaccines. This information is not intended to replace advice given to you by your health care provider. Make sure you discuss any questions you have with your health care provider. Document Released: 01/26/2002 Document Revised: 08/11/2018 Document Reviewed: 08/11/2018 Elsevier Patient Education  2020 [employer].

## 2019-09-12 NOTE — Progress Notes (Signed)
  Subjective:     Dawn Cain is a 38 y.o. female and is here for a comprehensive physical exam. The patient reports problems - polymenorrhea. Cycles are q 3 weeks. notes nausea. Cycles comes q 3 wks, some intermenstrual spotting. Tries antacids.   The following portions of the patient's history were reviewed and updated as appropriate: allergies, current medications, past family history, past medical history, past social history, past surgical history and problem list.  Review of Systems Pertinent items noted in HPI and remainder of comprehensive ROS otherwise negative.   Objective:    BP 122/71   Pulse 79   Ht 5\' 4"  (1.626 m)   Wt 227 lb 3.2 oz (103.1 kg)   LMP 09/03/2019 (Exact Date)   BMI 39.00 kg/m  General appearance: alert, cooperative and appears stated age Head: Normocephalic, without obvious abnormality, atraumatic Neck: no adenopathy, supple, symmetrical, trachea midline and thyroid not enlarged, symmetric, no tenderness/mass/nodules Lungs: clear to auscultation bilaterally Breasts: normal appearance, no masses or tenderness Heart: regular rate and rhythm, S1, S2 normal, no murmur, click, rub or gallop Abdomen: soft, non-tender; bowel sounds normal; no masses,  no organomegaly Pelvic: cervix normal in appearance, external genitalia normal, no adnexal masses or tenderness, no cervical motion tenderness, uterus normal size, shape, and consistency, vagina normal without discharge and uterus is retroverted Extremities: Homans sign is negative, no sign of DVT Pulses: 2+ and symmetric Skin: Skin color, texture, turgor normal. No rashes or lesions Lymph nodes: Cervical, supraclavicular, and axillary nodes normal. Neurologic: Grossly normal    Assessment:    Healthy female exam.      Plan:   Problem List Items Addressed This Visit      Unprioritized   Endometriosis    Discussed treatment options including Aygestin, Freida Busman as she is uninterested in contraception. She  will consider--this may be making nausea worse.      Infertility, female    Declines further w/u      Polymenorrhea    Check u/s and labs--with treatment based on w/u.      Relevant Orders   Follicle stimulating hormone   US PELVIC COMPLETE WITH TRANSVAGINAL    Other Visit Diagnoses    Encounter for gynecological examination without abnormal finding    -  Primary   Relevant Orders   MM 3D SCREEN BREAST BILATERAL   IGP, Aptima HPV, rfx 16/18,45   CBC   TSH   Comprehensive metabolic panel   Hemoglobin A1c   Lipid panel   VITAMIN D 25 Hydroxy (Vit-D Deficiency, Fractures)   Screening for malignant neoplasm of cervix         Return in 1 year (on 09/11/2020).    See After Visit Summary for Counseling Recommendations

## 2019-09-12 NOTE — Progress Notes (Signed)
Discuss cycle, pt states shes having periods every three weeks for the past year.   Mammo 3 years ago, wants to schedule  No Sti testing No flu

## 2019-09-12 NOTE — Assessment & Plan Note (Signed)
Check u/s and labs--with treatment based on w/u.

## 2019-09-12 NOTE — Assessment & Plan Note (Signed)
Declines further w/u

## 2019-09-13 LAB — COMPREHENSIVE METABOLIC PANEL
ALT: 13 IU/L (ref 0–32)
AST: 15 IU/L (ref 0–40)
Albumin/Globulin Ratio: 1.4 (ref 1.2–2.2)
Albumin: 4.4 g/dL (ref 3.8–4.8)
Alkaline Phosphatase: 67 IU/L (ref 39–117)
BUN/Creatinine Ratio: 17 (ref 9–23)
BUN: 13 mg/dL (ref 6–24)
Bilirubin Total: 0.5 mg/dL (ref 0.0–1.2)
CO2: 25 mmol/L (ref 20–29)
Calcium: 9.6 mg/dL (ref 8.7–10.2)
Chloride: 104 mmol/L (ref 96–106)
Creatinine, Ser: 0.77 mg/dL (ref 0.57–1.00)
GFR calc Af Amer: 112 mL/min/{1.73_m2} (ref >59)
GFR calc non Af Amer: 97 mL/min/{1.73_m2} (ref >59)
Globulin, Total: 3.2 g/dL (ref 1.5–4.5)
Glucose: 95 mg/dL (ref 65–99)
Potassium: 4.6 mmol/L (ref 3.5–5.2)
Sodium: 141 mmol/L (ref 134–144)
Total Protein: 7.6 g/dL (ref 6.0–8.5)

## 2019-09-13 LAB — VITAMIN D 25 HYDROXY (VIT D DEFICIENCY, FRACTURES): Vit D, 25-Hydroxy: 34.1 ng/mL (ref 30.0–100.0)

## 2019-09-13 LAB — CBC
Hematocrit: 39.7 % (ref 34.0–46.6)
Hemoglobin: 12.8 g/dL (ref 11.1–15.9)
MCH: 27.3 pg (ref 26.6–33.0)
MCHC: 32.2 g/dL (ref 31.5–35.7)
MCV: 85 fL (ref 79–97)
Platelets: 225 10*3/uL (ref 150–450)
RBC: 4.69 x10E6/uL (ref 3.77–5.28)
RDW: 13.6 % (ref 11.7–15.4)
WBC: 6.1 10*3/uL (ref 3.4–10.8)

## 2019-09-13 LAB — LIPID PANEL
Chol/HDL Ratio: 3.7 ratio (ref 0.0–4.4)
Cholesterol, Total: 138 mg/dL (ref 100–199)
HDL: 37 mg/dL — ABNORMAL LOW (ref >39)
LDL Chol Calc (NIH): 75 mg/dL (ref 0–99)
Triglycerides: 147 mg/dL (ref 0–149)
VLDL Cholesterol Cal: 26 mg/dL (ref 5–40)

## 2019-09-13 LAB — HEMOGLOBIN A1C
Est. average glucose Bld gHb Est-mCnc: 108 mg/dL
Hgb A1c MFr Bld: 5.4 % (ref 4.8–5.6)

## 2019-09-13 LAB — FOLLICLE STIMULATING HORMONE: FSH: 5.9 m[IU]/mL

## 2019-09-13 LAB — TSH: TSH: 2.07 u[IU]/mL (ref 0.450–4.500)

## 2019-09-15 LAB — IGP, APTIMA HPV, RFX 16/18,45: PAP Smear Comment: 0

## 2019-10-23 ENCOUNTER — Other Ambulatory Visit: Payer: Self-pay

## 2019-10-23 ENCOUNTER — Ambulatory Visit (INDEPENDENT_AMBULATORY_CARE_PROVIDER_SITE_OTHER): Payer: Managed Care, Other (non HMO) | Admitting: Internal Medicine

## 2019-10-23 ENCOUNTER — Encounter: Payer: Self-pay | Admitting: Internal Medicine

## 2019-10-23 VITALS — Temp 98.9°F | Ht 68.0 in | Wt 227.0 lb

## 2019-10-23 DIAGNOSIS — J029 Acute pharyngitis, unspecified: Secondary | ICD-10-CM

## 2019-10-23 DIAGNOSIS — G43829 Menstrual migraine, not intractable, without status migrainosus: Secondary | ICD-10-CM | POA: Diagnosis not present

## 2019-10-23 MED ORDER — AMOXICILLIN-POT CLAVULANATE 875-125 MG PO TABS: 1.0000 | ORAL_TABLET | Freq: Two times a day (BID) | ORAL | 0 refills | Status: AC

## 2019-10-23 MED ORDER — ALMOTRIPTAN MALATE 12.5 MG PO TABS: 12.5000 mg | ORAL_TABLET | ORAL | 5 refills | Status: DC | PRN

## 2019-10-23 NOTE — Progress Notes (Signed)
Date:  10/23/2019   Name:  Dawn Cain   DOB:  01-11-1981   MRN:  [id number]  I connected with this patient, Dawn Cain, by telephone at the patient's home.  I verified that I am speaking with the correct person using two identifiers. This visit was conducted via telephone due to the Covid-19 outbreak from my office at [facility] in [place], [place]. I discussed the limitations, risks, security and privacy concerns of performing an evaluation and management service by telephone. I also discussed with the patient that there may be a patient responsible charge related to this service. The patient expressed understanding and agreed to proceed.  Chief Complaint: Fever (Sore throat and fever X 3 days. Itchy throat- sneezing causes throat pain. Very few coughs. Gargling salt water to try and soothe throat. Runny nose- yellow mucous. Drainage in back of throat.   )  Sore Throat  This is a new problem. The current episode started in the past 7 days. The maximum temperature recorded prior to her arrival was 100.4 - 100.9 F. The pain is moderate. Associated symptoms include congestion (and post nasal drip), coughing, headaches, a plugged ear sensation, swollen glands and trouble swallowing. Pertinent negatives include no diarrhea, ear discharge, hoarse voice, shortness of breath or vomiting. She has tried gargles and acetaminophen for the symptoms. The treatment provided mild relief.  Migraine  This is a recurrent problem. The problem occurs intermittently. The pain quality is similar to prior headaches. Associated symptoms include coughing and swollen glands. Pertinent negatives include no vomiting. She has tried triptans for the symptoms. The treatment provided significant relief.    @ Lab Results  Component Value Date   CREATININE 0.77 09/12/2019   BUN 13 09/12/2019   NA 141 09/12/2019   K 4.6 09/12/2019   CL 104 09/12/2019   CO2 25 09/12/2019   @ Lab Results  Component Value Date   CHOL 138 09/12/2019   HDL 37 (L) 09/12/2019   LDLCALC 75 09/12/2019   TRIG 147 09/12/2019   CHOLHDL 3.7 09/12/2019   @ Lab Results  Component Value Date   TSH 2.070 09/12/2019   @ Lab Results  Component Value Date   HGBA1C 5.4 09/12/2019     Review of Systems  HENT: Positive for congestion (and post nasal drip) and trouble swallowing. Negative for ear discharge and hoarse voice.   Respiratory: Positive for cough. Negative for shortness of breath.   Gastrointestinal: Negative for diarrhea and vomiting.  Neurological: Positive for headaches.    Patient Active Problem List   Diagnosis Date Noted  . Polymenorrhea 09/12/2019  . Infertility, female 03/23/2018  . 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 Urinary, incontinence, stress female 03/23/2018  . Patellar tendonitis 12/15/2016  . Dysmenorrhea 06/05/2015  . Plantar fasciitis 06/05/2015  . Headache, menstrual migraine 06/05/2015  . Endometriosis 02/27/2014    Allergies  Allergen Reactions  . Erythromycin Shortness Of Breath and Swelling    Chest pain  . Imitrex [Sumatriptan]     neck muscle tightness    Past Surgical History:  Procedure Laterality Date  . ACHILLES TENDON SURGERY Right 04/09/2017   Procedure: ACHILLES TENDON REPAIR;  Surgeon: Samara Deist, DPM;  Location: [facility] ORS;  Service: Podiatry;  Laterality: Right;  . HEEL SPUR SURGERY  03/2017   04/09/2017  . 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 LIPOMA EXCISION Left 10/31/2015   Procedure: EXCISION LIPOMA;  Surgeon: Clayburn Pert,  MD;  Location: [facility] ORS;  Service: General;  Laterality: Left;  . OSTECTOMY Right 04/09/2017   Procedure: OSTECTOMY-Haglunds/Retrocalcaneal;  Surgeon: Samara Deist, DPM;  Location: [facility] ORS;  Service: Podiatry;  Laterality: Right;  . OVARIAN CYST REMOVAL Left 05/02/2009  . OVARIAN CYST REMOVAL Left 09/23/2006    Social History   Tobacco Use  . Smoking status: Never Smoker  . Smokeless  tobacco: Never Used  Substance Use Topics  . Alcohol use: Yes    Alcohol/week: 2.0 standard drinks    Types: 2 Standard drinks or equivalent per week    Comment: rare  . Drug use: No     Medication list has been reviewed and updated.  Current Meds  Medication Sig  . acetaminophen (TYLENOL) 500 MG tablet Take 1,000 mg by mouth daily as needed for headache.  . almotriptan (AXERT) 12.5 MG tablet Take 1 tablet (12.5 mg total) by mouth as needed for migraine. may repeat in 2 hours if needed  . calcium carbonate (TUMS - DOSED IN MG ELEMENTAL CALCIUM) 500 MG chewable tablet Chew 1 tablet by mouth daily as needed for indigestion or heartburn.  . Prenatal Multivit-Min-Fe-FA (PRE-NATAL PO) Take by mouth.    PHQ 2/9 Scores 10/23/2019 10/03/2018 08/05/2017  PHQ - 2 Score 0 0 0    BP Readings from Last 3 Encounters:  09/12/19 122/71  10/07/18 128/74  10/03/18 118/72    Physical Exam HENT:     Head:     Comments: Voice has 'thick tongue" sound Pulmonary:     Comments: No cough or dyspnea noted during the call Neurological:     Mental Status: She is alert.  Psychiatric:        Attention and Perception: Attention normal.        Mood and Affect: Mood normal.        Speech: Speech normal.        Cognition and Memory: Cognition normal.     Wt Readings from Last 3 Encounters:  10/23/19 227 lb (103 kg)  09/12/19 227 lb 3.2 oz (103.1 kg)  10/07/18 235 lb (106.6 kg)    Temp 98.9 F (37.2 C) (Oral)   Ht 5\' 8"  (1.727 m)   Wt 227 lb (103 kg)   BMI 34.52 kg/m   Assessment and Plan: 1. Acute pharyngitis, unspecified etiology Continue Tylenol and gargles May return to work tomorrow - amoxicillin-clavulanate (AUGMENTIN) 875-125 MG tablet; Take 1 tablet by mouth 2 (two) times daily for 10 days.  Dispense: 20 tablet; Refill: 0  2. Menstrual migraine without status migrainosus, not intractable Responding well to prn triptan therapy - almotriptan (AXERT) 12.5 MG tablet; Take 1 tablet  (12.5 mg total) by mouth as needed for migraine. may repeat in 2 hours if needed  Dispense: 12 tablet; Refill: 5  I spent 10 minutes on this encounter. Partially dictated using Editor, commissioning. Any errors are unintentional.  Halina Maidens, MD [facility]  10/23/2019

## 2019-10-25 ENCOUNTER — Ambulatory Visit: Payer: Managed Care, Other (non HMO)

## 2019-10-25 ENCOUNTER — Ambulatory Visit
Admission: RE | Admit: 2019-10-25 | Discharge: 2019-10-25 | Disposition: A | Discharge status: Home / Self Care | Payer: Managed Care, Other (non HMO) | Source: Ambulatory Visit | Attending: Family Medicine | Admitting: Family Medicine

## 2019-10-25 DIAGNOSIS — Z01419 Encounter for gynecological examination (general) (routine) without abnormal findings: Secondary | ICD-10-CM | POA: Insufficient documentation

## 2019-10-25 DIAGNOSIS — Z1231 Encounter for screening mammogram for malignant neoplasm of breast: Secondary | ICD-10-CM | POA: Insufficient documentation

## 2019-11-01 ENCOUNTER — Ambulatory Visit: Payer: Managed Care, Other (non HMO)

## 2019-12-13 ENCOUNTER — Emergency Department
Admission: EM | Admit: 2019-12-13 | Discharge: 2019-12-13 | Disposition: A | Discharge status: Home / Self Care | Payer: No Typology Code available for payment source | Attending: Emergency Medicine | Admitting: Emergency Medicine

## 2019-12-13 ENCOUNTER — Other Ambulatory Visit: Payer: Self-pay

## 2019-12-13 DIAGNOSIS — Z23 Encounter for immunization: Secondary | ICD-10-CM | POA: Diagnosis not present

## 2019-12-13 DIAGNOSIS — Y9389 Activity, other specified: Secondary | ICD-10-CM | POA: Diagnosis not present

## 2019-12-13 DIAGNOSIS — Z7721 Contact with and (suspected) exposure to potentially hazardous body fluids: Secondary | ICD-10-CM

## 2019-12-13 DIAGNOSIS — Y990 Civilian activity done for income or pay: Secondary | ICD-10-CM | POA: Diagnosis not present

## 2019-12-13 DIAGNOSIS — Y9289 Other specified places as the place of occurrence of the external cause: Secondary | ICD-10-CM | POA: Diagnosis not present

## 2019-12-13 DIAGNOSIS — W268XXA Contact with other sharp object(s), not elsewhere classified, initial encounter: Secondary | ICD-10-CM | POA: Diagnosis not present

## 2019-12-13 DIAGNOSIS — S61239A Puncture wound without foreign body of unspecified finger without damage to nail, initial encounter: Secondary | ICD-10-CM

## 2019-12-13 DIAGNOSIS — R8290 Unspecified abnormal findings in urine: Secondary | ICD-10-CM | POA: Diagnosis not present

## 2019-12-13 DIAGNOSIS — S61232A Puncture wound without foreign body of right middle finger without damage to nail, initial encounter: Secondary | ICD-10-CM | POA: Diagnosis present

## 2019-12-13 LAB — URINALYSIS, COMPLETE (UACMP) WITH MICROSCOPIC
Bacteria, UA: NONE SEEN
Bilirubin Urine: NEGATIVE
Glucose, UA: NEGATIVE mg/dL
Ketones, ur: NEGATIVE mg/dL
Leukocytes,Ua: NEGATIVE
Nitrite: NEGATIVE
Protein, ur: NEGATIVE mg/dL
Specific Gravity, Urine: 1.012 (ref 1.005–1.030)
pH: 5 (ref 5.0–8.0)

## 2019-12-13 MED ORDER — CEPHALEXIN 500 MG PO CAPS: 500.0000 mg | ORAL_CAPSULE | Freq: Three times a day (TID) | ORAL | 0 refills | Status: AC

## 2019-12-13 MED ORDER — TETANUS-DIPHTH-ACELL PERTUSSIS 5-2.5-18.5 LF-MCG/0.5 IM SUSP
0.5000 mL | Freq: Once | INTRAMUSCULAR | Status: AC
  Administered 2019-12-13: 11:00:00 0.5 mL via INTRAMUSCULAR
  Filled 2019-12-13: qty 0.5

## 2019-12-13 MED ORDER — CEPHALEXIN 500 MG PO CAPS: 500.0000 mg | ORAL_CAPSULE | Freq: Three times a day (TID) | ORAL | 0 refills | Status: DC

## 2019-12-13 NOTE — ED Triage Notes (Signed)
Pt states she works at [employer] and was doing her daily flush of the specimen collection machine and the plastic pipet came down and went through her right 3rd finger.

## 2019-12-13 NOTE — ED Notes (Signed)
Patient with exposure to needle device that came down on finger.  She says the device is used to disperse serum samples.  Says multiple samples--250  Are run on this device.  Has puncture wound

## 2019-12-13 NOTE — ED Provider Notes (Addendum)
[facility] Emergency Department Provider Note ____________________________________________  Time seen: [id number]  I have reviewed the triage vital signs and the nursing notes.  HISTORY  Chief Complaint  Finger Injury  HPI Dawn Cain is a 38 y.o. female presents her self to the ED for evaluation of an accidental puncture wound which occurred at work just prior to arrival.  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  She was inadvertently impaled by one of the plastic pipe pets, which went through her right third middle finger just radial to the proximal proximal IP.  She reports active bleeding immediately, but presents now with a wound that is otherwise benign.  She reports normal gross sensation, and normal range of motion to the finger.  She is here for further management of her potential blood-borne pathogen exposure.  She notes that the machine was not grossly contaminated with blood or body fluid, but it had not gone through its typical deep cleaning cycle.  Past Medical History:  Diagnosis Date  . Endometriosis 02/27/2014   dx'd laparoscopically    . GERD (gastroesophageal reflux disease)   . Headache   . Heel spur, right   . Infertility    trying for 11 years.  . Lipoma of back   . Miscarriage 03/23/2011    Patient Active Problem List   Diagnosis Date Noted  . Polymenorrhea 09/12/2019  . Infertility, female 03/23/2018  . 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 Urinary, incontinence, stress female 03/23/2018  . Patellar tendonitis 12/15/2016  . Dysmenorrhea 06/05/2015  . Plantar fasciitis 06/05/2015  . Headache, menstrual migraine 06/05/2015  . Endometriosis 02/27/2014    Past Surgical History:  Procedure Laterality Date  . ACHILLES TENDON SURGERY Right 04/09/2017   Procedure: ACHILLES TENDON REPAIR;  Surgeon: Samara Deist, DPM;  Location: [facility] OR[facility];  Service: Podiatry;  Laterality: Right;   . HEEL SPUR SURGERY  03/2017   04/09/2017  . 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 LIPOMA EXCISION Left 10/31/2015   Procedure: EXCISION LIPOMA;  Surgeon: Clayburn Pert, MD;  Location: [facility] ORS;  Service: General;  Laterality: Left;  . OSTECTOMY Right 04/09/2017   Procedure: OSTECTOMY-Haglunds/Retrocalcaneal;  Surgeon: Samara Deist, DPM;  Location: [facility] ORS;  Service: Podiatry;  Laterality: Right;  . OVARIAN CYST REMOVAL Left 05/02/2009  . OVARIAN CYST REMOVAL Left 09/23/2006    Prior to Admission medications   Medication Sig Start Date End Date Taking? Authorizing Provider  acetaminophen (TYLENOL) 500 MG tablet Take 1,000 mg by mouth daily as needed for headache.    [provider]  almotriptan (AXERT) 12.5 MG tablet Take 1 tablet (12.5 mg total) by mouth as needed for migraine. may repeat in 2 hours if needed 10/23/19   Glean Hess, MD  calcium carbonate (TUMS - DOSED IN MG ELEMENTAL CALCIUM) 500 MG chewable tablet Chew 1 tablet by mouth daily as needed for indigestion or heartburn.    [provider]  Prenatal Multivit-Min-Fe-FA (PRE-NATAL PO) Take by mouth.    [provider]    Allergies Erythromycin and Imitrex [sumatriptan]  Family History  Problem Relation Age of Onset  . Hypertension Father   . Emphysema Maternal Grandmother   . Cancer Maternal Grandfather        Lung  . Heart disease Paternal Grandmother   . Cancer Paternal Grandfather        stomach  Social History Social History   Tobacco Use  . Smoking status: Never Smoker  . Smokeless tobacco: Never Used  Substance Use Topics  . Alcohol use: Yes    Alcohol/week: 2.0 standard drinks    Types: 2 Standard drinks or equivalent per week    Comment: rare  . Drug use: No    Review of Systems  Constitutional: Negative for fever. Cardiovascular: Negative for chest pain. Respiratory: Negative for shortness of breath. Musculoskeletal: Negative  for back pain. Skin: Negative for rash. Finger puncture wound Neurological: Negative for headaches, focal weakness or numbness. ____________________________________________  PHYSICAL EXAM:  VITAL SIGNS: ED Triage Vitals  Enc Vitals Group     BP 12/13/19 0815 (!) 164/74     Pulse Rate 12/13/19 0815 79     Resp 12/13/19 0815 18     Temp 12/13/19 0815 99.6 F (37.6 C)     Temp Source 12/13/19 0815 Oral     SpO2 12/13/19 0815 97 %     Weight 12/13/19 0814 220 lb (99.8 kg)     Height 12/13/19 0814 5\' 4"  (1.626 m)     Head Circumference --      Peak Flow --      Pain Score 12/13/19 0824 8     Pain Loc --      Pain Edu? --      Excl. in [place]? --     Constitutional: Alert and oriented. Well appearing and in no distress. Head: Normocephalic and atraumatic. Eyes: Conjunctivae are normal. Normal extraocular movements Cardiovascular: Normal rate, regular rhythm. Normal distal pulses. Respiratory: Normal respiratory effort. No wheezes/rales/rhonchi. Gastrointestinal: Soft and nontender. No distention. Musculoskeletal: normal composite fist on the left.  Left index finger with a puncture wound noted to the dorsal and volar aspect of the index finger.  Normal flexion and extension range is noted.  Active bleeding is appreciated.  Nontender with normal range of motion in all extremities.  Neurologic:  Normal gross sensation. Normal speech and language. No gross focal neurologic deficits are appreciated. Skin:  Skin is warm, dry and intact. No rash noted. ____________________________________________   LABS (pertinent positives/negatives) Labs Reviewed  URINALYSIS, COMPLETE (UACMP) WITH MICROSCOPIC  POC URINE PREG, ED  ____________________________________________  PROCEDURES  Tdap 0.5 ml IM Procedures ____________________________________________  INITIAL IMPRESSION / ASSESSMENT AND PLAN / ED COURSE  Patient with ED evaluation of an accidental finger prick due to an automated pipetting  machine.  The machine is used to process human blood specimens.  Patient was counseled on blood pathogen exposure to an unknown source(s).  Information was provided on prophylactic medication as well as expected side effects and duration of treatment.  Patient was given time with printed information and time to review and formalize questions.  Verbalized understanding of the option to prophylax at this time.  She also was given instruction on the need for serial blood draw for HIV testing.  Patient at this time has declined prophylactic postexposure management.  Her tetanus was updated at this time, she was sent with a prescription for Keflex for prophylactic wound management.  A work note is provided for 1 day as requested patient is advised to follow-up with her company's Worker's Comp. provider.  She will need repeat blood labs in 3 months and 6 months.  She is also urged that she may return to the ED today or tomorrow should she change her mind and desire PEP at that time.  She again verbalized understanding and was discharged to  follow-up as directed.   ----------------------------------------- 7:07 PM on 12/13/2019 ----------------------------------------- L/M on patient cell phone reminding her to contact the charge RN or Kris Mouton, RN,  if she had questions or had made a different decision about PEP.   Dawn Cain was evaluated in Emergency Department on 12/13/2019 for the symptoms described in the history of present illness. She was evaluated in the context of the global COVID-19 pandemic, which necessitated consideration that the patient might be at risk for infection with the SARS-CoV-2 virus that causes COVID-19. Institutional protocols and algorithms that pertain to the evaluation of patients at risk for COVID-19 are in a state of rapid change based on information released by regulatory bodies including the CDC and federal and state organizations. These policies and algorithms were followed  during the patient's care in the ED. ____________________________________________  FINAL CLINICAL IMPRESSION(S) / ED DIAGNOSES  Final diagnoses:  Exposure to bloodborne pathogen  Puncture wound of finger of left hand, initial encounter      Melvenia Needles, PA-C 12/13/19 1907    Melvenia Needles, PA-C 12/13/19 Andee Poles, MD 12/15/19 [phone]

## 2019-12-13 NOTE — ED Notes (Signed)
Delay in discharge after pt moved to OTF in epic. Pt left ED in NAD at 1316 in NAD.

## 2019-12-13 NOTE — Discharge Instructions (Signed)
You have been evaluated for a potential bloodborne pathogen exposure. Your baseline labs have been drawn and will be reported to your as available. Keep the wound clean, dry, and covered. Take the antibiotic as directed. Follow-up with [facility] or your company's preferred medical provider. Return to the ED as needed.

## 2019-12-13 NOTE — ED Notes (Signed)
Blood drawn for [facility] BBP exposure and for ETOH. Chain of custody followed and pt reports understanding of all legality with blood work and follow up with HR to discuss further testing a resources. Pt reporting she does not need further prophylaxis in ED today.

## 2020-03-14 ENCOUNTER — Encounter: Payer: Self-pay | Admitting: Internal Medicine

## 2020-03-14 ENCOUNTER — Ambulatory Visit: Payer: Managed Care, Other (non HMO) | Attending: Internal Medicine

## 2020-03-14 ENCOUNTER — Telehealth: Payer: Self-pay

## 2020-03-14 ENCOUNTER — Other Ambulatory Visit: Payer: Self-pay

## 2020-03-14 ENCOUNTER — Telehealth (INDEPENDENT_AMBULATORY_CARE_PROVIDER_SITE_OTHER): Payer: Managed Care, Other (non HMO) | Admitting: Internal Medicine

## 2020-03-14 VITALS — Temp 99.0°F

## 2020-03-14 DIAGNOSIS — R059 Cough, unspecified: Secondary | ICD-10-CM

## 2020-03-14 DIAGNOSIS — R05 Cough: Secondary | ICD-10-CM

## 2020-03-14 DIAGNOSIS — Z20822 Contact with and (suspected) exposure to covid-19: Secondary | ICD-10-CM

## 2020-03-14 MED ORDER — PROMETHAZINE-DM 6.25-15 MG/5ML PO SYRP: 5.0000 mL | ORAL_SOLUTION | Freq: Four times a day (QID) | ORAL | 0 refills | Status: DC | PRN

## 2020-03-14 MED ORDER — DOXYCYCLINE HYCLATE 100 MG PO TABS: 100.0000 mg | ORAL_TABLET | Freq: Two times a day (BID) | ORAL | 0 refills | Status: AC

## 2020-03-14 NOTE — Progress Notes (Signed)
Date:  03/14/2020   Name:  Dawn Cain   DOB:  11-22-1981   MRN:  [id number]   Chief Complaint: Fever (X 2 days , post nasal drip, cough with very little mucous- body aches, hoursness, heavy in chest)  Cough This is a new problem. The current episode started in the past 7 days. The cough is non-productive. Associated symptoms include chills, a fever, headaches, myalgias, postnasal drip, a sore throat and shortness of breath. Pertinent negatives include no chest pain, nasal congestion or wheezing. She has tried OTC cough suppressant (claritin) for the symptoms. The treatment provided mild relief.    Lab Results  Component Value Date   CREATININE 0.77 09/12/2019   BUN 13 09/12/2019   NA 141 09/12/2019   K 4.6 09/12/2019   CL 104 09/12/2019   CO2 25 09/12/2019   Lab Results  Component Value Date   CHOL 138 09/12/2019   HDL 37 (L) 09/12/2019   LDLCALC 75 09/12/2019   TRIG 147 09/12/2019   CHOLHDL 3.7 09/12/2019   Lab Results  Component Value Date   TSH 2.070 09/12/2019   Lab Results  Component Value Date   HGBA1C 5.4 09/12/2019   Lab Results  Component Value Date   WBC 6.1 09/12/2019   HGB 12.8 09/12/2019   HCT 39.7 09/12/2019   MCV 85 09/12/2019   PLT 225 09/12/2019   Lab Results  Component Value Date   ALT 13 09/12/2019   AST 15 09/12/2019   ALKPHOS 67 09/12/2019   BILITOT 0.5 09/12/2019     Review of Systems  Constitutional: Positive for chills and fever.  HENT: Positive for postnasal drip and sore throat.   Respiratory: Positive for cough and shortness of breath. Negative for wheezing.   Cardiovascular: Negative for chest pain and palpitations.  Musculoskeletal: Positive for myalgias.  Neurological: Positive for headaches. Negative for dizziness.    Patient Active Problem List   Diagnosis Date Noted  . Polymenorrhea 09/12/2019  . Infertility, female 03/23/2018  . 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 Urinary, incontinence, stress female  03/23/2018  . Patellar tendonitis 12/15/2016  . Dysmenorrhea 06/05/2015  . Plantar fasciitis 06/05/2015  . Headache, menstrual migraine 06/05/2015  . Endometriosis 02/27/2014    Allergies  Allergen Reactions  . Erythromycin Shortness Of Breath and Swelling    Chest pain  . Imitrex [Sumatriptan]     neck muscle tightness    Past Surgical History:  Procedure Laterality Date  . ACHILLES TENDON SURGERY Right 04/09/2017   Procedure: ACHILLES TENDON REPAIR;  Surgeon: Samara Deist, DPM;  Location: [facility] OR[facility];  Service: Podiatry;  Laterality: Right;  . HEEL SPUR SURGERY  03/2017   04/09/2017  . 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 LIPOMA EXCISION Left 10/31/2015   Procedure: EXCISION LIPOMA;  Surgeon: Clayburn Pert, MD;  Location: [facility] ORS;  Service: General;  Laterality: Left;  . OSTECTOMY Right 04/09/2017   Procedure: OSTECTOMY-H[place]lunds/Retrocalcaneal;  Surgeon: Samara Deist, DPM;  Location: [facility] ORS;  Service: Podiatry;  Laterality: Right;  . OVARIAN CYST REMOVAL Left 05/02/2009  . OVARIAN CYST REMOVAL Left 09/23/2006    Social History   Tobacco Use  . Smoking status: Never Smoker  . Smokeless tobacco: Never Used  Substance Use Topics  . Alcohol use: Yes    Alcohol/week: 2.0 standard drinks    Types: 2 Standard drinks or equivalent per week    Comment: rare  . Drug use: No  Medication list has been reviewed and updated.  Current Meds  Medication Sig  . acetaminophen (TYLENOL) 500 MG tablet Take 1,000 mg by mouth daily as needed for headache.  . almotriptan (AXERT) 12.5 MG tablet Take 1 tablet (12.5 mg total) by mouth as needed for migraine. may repeat in 2 hours if needed  . calcium carbonate (TUMS - DOSED IN MG ELEMENTAL CALCIUM) 500 MG chewable tablet Chew 1 tablet by mouth daily as needed for indigestion or heartburn.  . Prenatal Multivit-Min-Fe-FA (PRE-NATAL PO) Take by mouth.    PHQ 2/9 Scores 03/14/2020 10/23/2019 10/03/2018  08/05/2017  PHQ - 2 Score 0 0 0 0    BP Readings from Last 3 Encounters:  12/13/19 (!) 164/74  09/12/19 122/71  10/07/18 128/74    Physical Exam Pulmonary:     Comments: occasional cough and mild dyspnea noted during the call Neurological:     Mental Status: She is alert.  Psychiatric:        Attention and Perception: Attention normal.        Mood and Affect: Mood normal.        Speech: Speech normal.        Cognition and Memory: Cognition normal.     Wt Readings from Last 3 Encounters:  12/13/19 220 lb (99.8 kg)  10/23/19 227 lb (103 kg)  09/12/19 227 lb 3.2 oz (103.1 kg)    Temp 99 F (37.2 C)   Assessment and Plan: 1. Cough Concern for Covid-19 Pt referred for testing Tylenol and fluids for HA and body aches - doxycycline (VIBRA-TABS) 100 MG tablet; Take 1 tablet (100 mg total) by mouth 2 (two) times daily for 10 days.  Dispense: 20 tablet; Refill: 0 - promethazine-dextromethorphan (PROMETHAZINE-DM) 6.25-15 MG/5ML syrup; Take 5 mLs by mouth 4 (four) times daily as needed for cough.  Dispense: 240 mL; Refill: 0   Partially dictated using Editor, commissioning. Any errors are unintentional.  Halina Maidens, MD [facility]  03/14/2020

## 2020-03-14 NOTE — Telephone Encounter (Signed)
This visit type is being conducted due to national recommendations for restrictions regarding the COVID- 19 Pandemic (e.g. social distancing) in effort to limit this patients exposure and mitigate transmission in our community. This visit type is felt to be most appropriate for this patient at this time.   I connected with the patient today and received telephone consent from the patient and patient understand this consent will be good for 1 year.  CM 

## 2020-03-15 LAB — NOVEL CORONAVIRUS, NAA: SARS-CoV-2, NAA: DETECTED — AB

## 2020-03-15 LAB — SARS-COV-2, NAA 2 DAY TAT

## 2020-03-16 ENCOUNTER — Telehealth: Payer: Self-pay | Admitting: Physician Assistant

## 2020-03-16 NOTE — Telephone Encounter (Signed)
Called to discuss with patient about Covid symptoms and the use of bamlanivimab or casirivimab/imdevimab, a monoclonal antibody infusion for those with mild to moderate Covid symptoms and at a high risk of hospitalization.  Pt MAY qualify for this infusion at the [facility] infusion center due to BMI>35. We need to clarify her weight/height.    Message left to call back and left mychart message.  Angelena Form PA-C  [facility]

## 2020-03-19 ENCOUNTER — Encounter: Payer: Self-pay | Admitting: Internal Medicine

## 2020-03-20 ENCOUNTER — Telehealth: Payer: Self-pay

## 2020-03-20 NOTE — Telephone Encounter (Signed)
Called pt to let her know that we filled out our FMLA section but she needs to send me a signed copy of her medical release form before I can legally fax it.   She verbalized understanding.  CM

## 2020-04-09 ENCOUNTER — Ambulatory Visit: Payer: Managed Care, Other (non HMO) | Admitting: Obstetrics and Gynecology

## 2020-04-11 ENCOUNTER — Ambulatory Visit (INDEPENDENT_AMBULATORY_CARE_PROVIDER_SITE_OTHER): Payer: Managed Care, Other (non HMO) | Admitting: Obstetrics and Gynecology

## 2020-04-11 ENCOUNTER — Other Ambulatory Visit: Payer: Self-pay

## 2020-04-11 VITALS — BP 118/79 | HR 88 | Ht 64.0 in | Wt 234.0 lb

## 2020-04-11 DIAGNOSIS — R102 Pelvic and perineal pain: Secondary | ICD-10-CM

## 2020-04-11 DIAGNOSIS — Z3202 Encounter for pregnancy test, result negative: Secondary | ICD-10-CM | POA: Diagnosis not present

## 2020-04-11 DIAGNOSIS — N83201 Unspecified ovarian cyst, right side: Secondary | ICD-10-CM

## 2020-04-11 LAB — POCT URINALYSIS DIPSTICK
Bilirubin, UA: NEGATIVE
Glucose, UA: NEGATIVE
Ketones, UA: NEGATIVE
Nitrite, UA: NEGATIVE
Odor: NORMAL
Protein, UA: NEGATIVE
Spec Grav, UA: 1.01 (ref 1.010–1.025)
Urobilinogen, UA: 0.2 E.U./dL
pH, UA: 5 (ref 5.0–8.0)

## 2020-04-11 LAB — POCT URINE PREGNANCY: Preg Test, Ur: NEGATIVE

## 2020-04-11 NOTE — Progress Notes (Signed)
Obstetrics and Gynecology Visit Return Patient Evaluation  Appointment Date: 04/11/2020  Primary Care Provider: [place], [place] for [facility]  Chief Complaint: persistent RLQ  History of Present Illness:  Dawn Cain is a 39 y.o. with above CC. LMP two weeks ago   Patient with chronic RLQ pain that feels like a dull ache and at the low back. No lower urinary tract s/s, GI s/s, VB.  Patient usually has cyclic RLQ pain around the time of her period that feels like her usual cyst pain but it only lasts for a day or two, but this time it has persisted for about week.  It has waned it intensity starting today.  Review of Systems: Pertinent items noted in HPI and remainder of comprehensive ROS otherwise negative.   Medications:  Shwetha Marucci. Hodge had no medications administered during this visit. Current Outpatient Medications  Medication Sig Dispense Refill  . acetaminophen (TYLENOL) 500 MG tablet Take 1,000 mg by mouth daily as needed for headache.    . almotriptan (AXERT) 12.5 MG tablet Take 1 tablet (12.5 mg total) by mouth as needed for migraine. may repeat in 2 hours if needed (Patient not taking: Reported on 04/11/2020) 12 tablet 5  . calcium carbonate (TUMS - DOSED IN MG ELEMENTAL CALCIUM) 500 MG chewable tablet Chew 1 tablet by mouth daily as needed for indigestion or heartburn.    . Prenatal Multivit-Min-Fe-FA (PRE-NATAL PO) Take by mouth.    . promethazine-dextromethorphan (PROMETHAZINE-DM) 6.25-15 MG/5ML syrup Take 5 mLs by mouth 4 (four) times daily as needed for cough. (Patient not taking: Reported on 04/11/2020) 240 mL 0   No current facility-administered medications for this visit.    Allergies: is allergic to erythromycin and imitrex [sumatriptan].  Physical Exam:  BP 118/79   Pulse 88   Ht 5\' 4"  (1.626 m)   Wt 234 lb (106.1 kg)   LMP 03/28/2020   BMI 40.17 kg/m  Body mass index is 40.17 kg/m. General appearance: Well nourished, well  developed female in no acute distress.  Abdomen:+BS, minimally ttp in the llq, no peritoneal s/s, non distended, and no masses, hernias Back: no CVAT Neuro/Psych:  Normal mood and affect.    Pelvic exam:  Deferred  Radiology: TVUS shows very retroverted uterus, small, normal mid cycle stripe; left ovary not seen. Right ovary seen with simple appearing cyst. Overall RO size is 4.6 x 3.6cm with 3.5 x 3.8cm cyst; the cyst has hypoechoic, homogenous fluid with no flow in the cyst walls but with +AV flow to the ovary  No free fluid in the pelvis  Labs: UPT and u/a neg   Assessment: pt stable  Plan:  1. Pain, female pelvic I told her that I recommend exp management and ED precautions given. If s/s persist after 4-6wks, I told her to call us to rescan and if still there and a decent size then surgical cyst removal can be considered. Pt somewhat trying to get pregnant. I told her that if she wanted to prevent future cysts it would be with some type of hormone that unfortunately also would act as birth control.   RTC: PRN.   Durene Romans MD Attending Center for [employer])

## 2020-04-11 NOTE — Progress Notes (Signed)
uHas had right sided ovary pain started 7 days ago and is worse when having a bowel movement. She does not have a left  ovary due to surgical removal and history of cyst/ tumor.

## 2020-07-01 ENCOUNTER — Encounter: Payer: Self-pay | Admitting: Radiology

## 2020-09-23 ENCOUNTER — Other Ambulatory Visit: Payer: Self-pay

## 2020-09-23 ENCOUNTER — Ambulatory Visit: Payer: Managed Care, Other (non HMO) | Admitting: Internal Medicine

## 2020-09-23 ENCOUNTER — Encounter: Payer: Self-pay | Admitting: Internal Medicine

## 2020-09-23 VITALS — BP 122/78 | HR 84 | Temp 98.5°F | Ht 64.0 in | Wt 232.0 lb

## 2020-09-23 DIAGNOSIS — L723 Sebaceous cyst: Secondary | ICD-10-CM | POA: Diagnosis not present

## 2020-09-23 DIAGNOSIS — Z1231 Encounter for screening mammogram for malignant neoplasm of breast: Secondary | ICD-10-CM

## 2020-09-23 DIAGNOSIS — Z6839 Body mass index (BMI) 39.0-39.9, adult: Secondary | ICD-10-CM | POA: Diagnosis not present

## 2020-09-23 NOTE — Progress Notes (Signed)
Date:  09/23/2020   Name:  Dawn Cain   DOB:  1981/10/31   MRN:  [id number]   Chief Complaint: Mass (X2 months, middle finger on right hand, not painful, hard, hasnt gotten bigger or smaller )  HPI Finger mass - noticed about 2 months ago.  No pain or trauma. It has not enlarged.  Location on the inner distal phalanx of the right middle finger.  It does not restrict movement or sensation but she notices it when she puts pressure on it.  High BMI - she has a BMI exemption form from [facility] that she would like signed.  She is planning to resume vegetarian diet and begin more regular exercise. She denies shortness of breath or chest pain.    HM - she is due for annual mammogram next month.  Last ordered by GYN.  She denies breast issues, lumps, tenderness.  Lab Results  Component Value Date   CREATININE 0.77 09/12/2019   BUN 13 09/12/2019   NA 141 09/12/2019   K 4.6 09/12/2019   CL 104 09/12/2019   CO2 25 09/12/2019   Lab Results  Component Value Date   CHOL 138 09/12/2019   HDL 37 (L) 09/12/2019   LDLCALC 75 09/12/2019   TRIG 147 09/12/2019   CHOLHDL 3.7 09/12/2019   Lab Results  Component Value Date   TSH 2.070 09/12/2019   Lab Results  Component Value Date   HGBA1C 5.4 09/12/2019   Lab Results  Component Value Date   WBC 6.1 09/12/2019   HGB 12.8 09/12/2019   HCT 39.7 09/12/2019   MCV 85 09/12/2019   PLT 225 09/12/2019   Lab Results  Component Value Date   ALT 13 09/12/2019   AST 15 09/12/2019   ALKPHOS 67 09/12/2019   BILITOT 0.5 09/12/2019     Review of Systems  Constitutional: Positive for unexpected weight change (gained back what she had lost last year ~20 lbs). Negative for chills and fatigue.  Respiratory: Negative for chest tightness and shortness of breath.   Cardiovascular: Negative for chest pain.  Musculoskeletal: Negative for arthralgias and joint swelling.       Lump on right middle finger  Neurological: Negative for weakness and  numbness.    Patient Active Problem List   Diagnosis Date Noted  . Right ovarian cyst 04/11/2020  . Polymenorrhea 09/12/2019  . Infertility, female 03/23/2018  . 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 Urinary, incontinence, stress female 03/23/2018  . Patellar tendonitis 12/15/2016  . Dysmenorrhea 06/05/2015  . Plantar fasciitis 06/05/2015  . Headache, menstrual migraine 06/05/2015  . Endometriosis 02/27/2014    Allergies  Allergen Reactions  . Erythromycin Shortness Of Breath and Swelling    Chest pain  . Imitrex [Sumatriptan]     neck muscle tightness    Past Surgical History:  Procedure Laterality Date  . ACHILLES TENDON SURGERY Right 04/09/2017   Procedure: ACHILLES TENDON REPAIR;  Surgeon: Samara Deist, DPM;  Location: [facility] OR[facility];  Service: Podiatry;  Laterality: Right;  . HEEL SPUR SURGERY  03/2017   04/09/2017  . 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 LIPOMA EXCISION Left 10/31/2015   Procedure: EXCISION LIPOMA;  Surgeon: Clayburn Pert, MD;  Location: [facility] ORS;  Service: General;  Laterality: Left;  . OSTECTOMY Right 04/09/2017   Procedure: OSTECTOMY-H[place]lunds/Retrocalcaneal;  Surgeon: Samara Deist, DPM;  Location: [facility] ORS;  Service: Podiatry;  Laterality: Right;  . OVARIAN CYST REMOVAL Left 05/02/2009  .  OVARIAN CYST REMOVAL Left 09/23/2006    Social History   Tobacco Use  . Smoking status: Never Smoker  . Smokeless tobacco: Never Used  Vaping Use  . Vaping Use: Never used  Substance Use Topics  . Alcohol use: Yes    Alcohol/week: 2.0 standard drinks    Types: 2 Standard drinks or equivalent per week    Comment: rare  . Drug use: No     Medication list has been reviewed and updated.  Current Meds  Medication Sig  . acetaminophen (TYLENOL) 500 MG tablet Take 1,000 mg by mouth daily as needed for headache.  . almotriptan (AXERT) 12.5 MG tablet Take 1 tablet (12.5 mg total) by mouth as needed for migraine. may repeat in  2 hours if needed  . calcium carbonate (TUMS - DOSED IN MG ELEMENTAL CALCIUM) 500 MG chewable tablet Chew 1 tablet by mouth daily as needed for indigestion or heartburn.  . Prenatal Multivit-Min-Fe-FA (PRE-NATAL PO) Take by mouth.    PHQ 2/9 Scores 03/14/2020 10/23/2019 10/03/2018 08/05/2017  PHQ - 2 Score 0 0 0 0    No flowsheet data found.  BP Readings from Last 3 Encounters:  09/23/20 122/78  04/11/20 118/79  12/13/19 (!) 164/74    Physical Exam Vitals and nursing note reviewed.  Constitutional:      General: She is not in acute distress.    Appearance: She is well-developed.  HENT:     Head: Normocephalic and atraumatic.  Pulmonary:     Effort: Pulmonary effort is normal. No respiratory distress.  Musculoskeletal:        General: Normal range of motion.       Arms:  Skin:    General: Skin is warm and dry.     Findings: No rash.  Neurological:     Mental Status: She is alert and oriented to person, place, and time.  Psychiatric:        Behavior: Behavior normal.        Thought Content: Thought content normal.     Wt Readings from Last 3 Encounters:  09/23/20 232 lb (105.2 kg)  04/11/20 234 lb (106.1 kg)  12/13/19 220 lb (99.8 kg)    BP 122/78 (BP Location: Right Arm, Patient Position: Sitting)   Pulse 84   Temp 98.5 F (36.9 C) (Oral)   Ht 5\' 4"  (1.626 m)   Wt 232 lb (105.2 kg)   SpO2 97%   BMI 39.82 kg/m   Assessment and Plan: 1. Sebaceous cyst of finger of right hand Appears benign - it is not causing any symptoms currently Will monitor and refer to Ortho if enlarging or causing sx  2. BMI 39.0-39.9,adult Form completed for [facility] Pt will resume vegetarian diet, limit portions and exercise 45 min three times per week  3. Encounter for screening mammogram for breast cancer To be scheduled in November at [place]; Future   Partially dictated using Editor, commissioning. Any errors are unintentional.  Halina Maidens,  MD [facility]  09/23/2020

## 2020-10-03 ENCOUNTER — Other Ambulatory Visit: Payer: Self-pay | Admitting: Podiatry

## 2020-10-03 DIAGNOSIS — M7672 Peroneal tendinitis, left leg: Secondary | ICD-10-CM

## 2020-10-06 ENCOUNTER — Ambulatory Visit: Payer: Managed Care, Other (non HMO)

## 2020-10-24 ENCOUNTER — Other Ambulatory Visit: Payer: Self-pay

## 2020-10-24 ENCOUNTER — Ambulatory Visit
Admission: RE | Admit: 2020-10-24 | Discharge: 2020-10-24 | Disposition: A | Discharge status: Home / Self Care | Payer: Managed Care, Other (non HMO) | Source: Ambulatory Visit | Attending: Podiatry | Admitting: Podiatry

## 2020-10-24 DIAGNOSIS — M7672 Peroneal tendinitis, left leg: Secondary | ICD-10-CM | POA: Insufficient documentation

## 2020-10-29 ENCOUNTER — Other Ambulatory Visit: Payer: Self-pay

## 2020-10-29 ENCOUNTER — Ambulatory Visit
Admission: RE | Admit: 2020-10-29 | Discharge: 2020-10-29 | Disposition: A | Discharge status: Home / Self Care | Payer: Managed Care, Other (non HMO) | Source: Ambulatory Visit | Attending: Internal Medicine | Admitting: Internal Medicine

## 2020-10-29 DIAGNOSIS — Z1231 Encounter for screening mammogram for malignant neoplasm of breast: Secondary | ICD-10-CM | POA: Insufficient documentation

## 2020-12-17 ENCOUNTER — Encounter: Payer: Self-pay | Admitting: Internal Medicine

## 2020-12-17 ENCOUNTER — Other Ambulatory Visit: Payer: Self-pay

## 2020-12-17 DIAGNOSIS — L723 Sebaceous cyst: Secondary | ICD-10-CM

## 2021-02-24 ENCOUNTER — Other Ambulatory Visit: Payer: Self-pay | Admitting: Internal Medicine

## 2021-02-24 DIAGNOSIS — G43829 Menstrual migraine, not intractable, without status migrainosus: Secondary | ICD-10-CM

## 2021-02-24 NOTE — Telephone Encounter (Signed)
Requested medications are due for refill today yes  Requested medications are on the active medication list yes  Last refill 7/27  Last visit 10/2019  Future visit scheduled no  Notes to clinic Failed protocol due to no valid visit within 12  months, no upcoming visit scheduled.

## 2021-03-12 ENCOUNTER — Encounter: Payer: Self-pay | Admitting: Internal Medicine

## 2021-03-12 NOTE — Progress Notes (Signed)
Date:  03/13/2021   Name:  Dawn Cain   DOB:  Oct 02, 1981   MRN:  [id number]   Chief Complaint: Annual Exam (Breast exam no pap)  Dawn Cain is a 40 y.o. female who presents today for her Complete Annual Exam. She feels well. She reports exercising treadmill/ rowing machine X2-5 days a week. She reports she is sleeping fairly well. Breast complaints none.  Mammogram: 11/02/20 Pap smear: 08/2019 thin prep by GYN Colonoscopy: none  Immunization History  Administered Date(s) Administered  . Tdap 12/15/2005, 12/13/2019    Depression        This is a new problem.  The current episode started more than 1 month ago.   The onset quality is gradual.   Associated symptoms include hopelessness, decreased interest and sad.  Associated symptoms include no fatigue, no headaches and no suicidal ideas.     The symptoms are aggravated by family issues.  Past treatments include nothing. She is in marriage counseling and individual counseling.  It has helped a bit but she remains tearful, uninterested in her usual activities with decreased energy and libido.    Lab Results  Component Value Date   CREATININE 0.77 09/12/2019   BUN 13 09/12/2019   NA 141 09/12/2019   K 4.6 09/12/2019   CL 104 09/12/2019   CO2 25 09/12/2019   Lab Results  Component Value Date   CHOL 138 09/12/2019   HDL 37 (L) 09/12/2019   LDLCALC 75 09/12/2019   TRIG 147 09/12/2019   CHOLHDL 3.7 09/12/2019   Lab Results  Component Value Date   TSH 2.070 09/12/2019   Lab Results  Component Value Date   HGBA1C 5.4 09/12/2019   Lab Results  Component Value Date   WBC 6.1 09/12/2019   HGB 12.8 09/12/2019   HCT 39.7 09/12/2019   MCV 85 09/12/2019   PLT 225 09/12/2019   Lab Results  Component Value Date   ALT 13 09/12/2019   AST 15 09/12/2019   ALKPHOS 67 09/12/2019   BILITOT 0.5 09/12/2019     Review of Systems  Constitutional: Negative for chills, fatigue and fever.  HENT: Negative for congestion, hearing  loss, tinnitus, trouble swallowing and voice change.   Eyes: Negative for visual disturbance.  Respiratory: Negative for cough, chest tightness, shortness of breath and wheezing.   Cardiovascular: Negative for chest pain, palpitations and leg swelling.  Gastrointestinal: Negative for abdominal pain, constipation, diarrhea and vomiting.  Endocrine: Negative for polydipsia and polyuria.  Genitourinary: Negative for dysuria, frequency, genital sores, vaginal bleeding and vaginal discharge.  Musculoskeletal: Negative for arthralgias, gait problem and joint swelling.  Skin: Negative for color change and rash.  Neurological: Negative for dizziness, tremors, light-headedness and headaches.  Hematological: Negative for adenopathy. Does not bruise/bleed easily.  Psychiatric/Behavioral: Positive for depression and dysphoric mood. Negative for sleep disturbance and suicidal ideas. The patient is not nervous/anxious.     Patient Active Problem List   Diagnosis Date Noted  . Sebaceous cyst of finger of right hand 09/23/2020  . Right ovarian cyst 04/11/2020  . Polymenorrhea 09/12/2019  . Infertility, female 03/23/2018  . 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 Urinary, incontinence, stress female 03/23/2018  . Plantar fasciitis 06/05/2015  . Headache, menstrual migraine 06/05/2015  . Endometriosis 02/27/2014    Allergies  Allergen Reactions  . Erythromycin Shortness Of Breath and Swelling    Chest pain  . Imitrex [Sumatriptan]     neck muscle tightness    Past  Surgical History:  Procedure Laterality Date  . ACHILLES TENDON SURGERY Right 04/09/2017   Procedure: ACHILLES TENDON REPAIR;  Surgeon: Samara Deist, DPM;  Location: [facility] OR[facility];  Service: Podiatry;  Laterality: Right;  . HEEL SPUR SURGERY  03/2017   04/09/2017  . 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 LIPOMA EXCISION Left 10/31/2015   Procedure: EXCISION LIPOMA;  Surgeon: Clayburn Pert, MD;  Location:  [facility] ORS;  Service: General;  Laterality: Left;  . OSTECTOMY Right 04/09/2017   Procedure: OSTECTOMY-[place]/Retrocalcaneal;  Surgeon: Samara Deist, DPM;  Location: [facility] ORS;  Service: Podiatry;  Laterality: Right;  . OVARIAN CYST REMOVAL Left 05/02/2009  . OVARIAN CYST REMOVAL Left 09/23/2006    Social History   Tobacco Use  . Smoking status: Never Smoker  . Smokeless tobacco: Never Used  Vaping Use  . Vaping Use: Never used  Substance Use Topics  . Alcohol use: Yes    Alcohol/week: 2.0 standard drinks    Types: 2 Standard drinks or equivalent per week    Comment: rare  . Drug use: No     Medication list has been reviewed and updated.  Current Meds  Medication Sig  . acetaminophen (TYLENOL) 500 MG tablet Take 1,000 mg by mouth daily as needed for headache.  . almotriptan (AXERT) 12.5 MG tablet Take 1 tablet (12.5 mg total) by mouth as needed for migraine. may repeat in 2 hours if needed  . calcium carbonate (TUMS - DOSED IN MG ELEMENTAL CALCIUM) 500 MG chewable tablet Chew 1 tablet by mouth daily as needed for indigestion or heartburn.  . Prenatal Multivit-Min-Fe-FA (PRE-NATAL PO) Take by mouth.    PHQ 2/9 Scores 03/13/2021 03/14/2020 10/23/2019 10/03/2018  PHQ - 2 Score 2 0 0 0  PHQ- 9 Score 4 - - -    GAD 7 : Generalized Anxiety Score 03/13/2021  Nervous, Anxious, on Edge 0  Control/stop worrying 0  Worry too much - different things 0  Trouble relaxing 0  Restless 0  Easily annoyed or irritable 0  Afraid - awful might happen 0  Total GAD 7 Score 0    BP Readings from Last 3 Encounters:  03/13/21 124/84  09/23/20 122/78  04/11/20 118/79    Physical Exam Vitals and nursing note reviewed.  Constitutional:      General: She is not in acute distress.    Appearance: She is well-developed.  HENT:     Head: Normocephalic and atraumatic.     Right Ear: Tympanic membrane and ear canal normal.     Left Ear: Tympanic membrane and ear canal normal.     Nose:      Right Sinus: No maxillary sinus tenderness.     Left Sinus: No maxillary sinus tenderness.  Eyes:     General: No scleral icterus.       Right eye: No discharge.        Left eye: No discharge.     Conjunctiva/sclera: Conjunctivae normal.  Neck:     Thyroid: No thyromegaly.     Vascular: No carotid bruit.  Cardiovascular:     Rate and Rhythm: Normal rate and regular rhythm.     Pulses: Normal pulses.     Heart sounds: Normal heart sounds.  Pulmonary:     Effort: Pulmonary effort is normal. No respiratory distress.     Breath sounds: No wheezing.  Chest:  Breasts:     Right: No mass, nipple discharge, skin change or  tenderness.     Left: No mass, nipple discharge, skin change or tenderness.    Abdominal:     General: Bowel sounds are normal.     Palpations: Abdomen is soft.     Tenderness: There is no abdominal tenderness.  Musculoskeletal:     Cervical back: Normal range of motion. No erythema.     Right lower leg: No edema.     Left lower leg: No edema.  Lymphadenopathy:     Cervical: No cervical adenopathy.  Skin:    General: Skin is warm and dry.     Findings: No rash.  Neurological:     Mental Status: She is alert and oriented to person, place, and time.     Cranial Nerves: No cranial nerve deficit.     Sensory: No sensory deficit.     Deep Tendon Reflexes: Reflexes are normal and symmetric.  Psychiatric:        Attention and Perception: Attention normal.        Mood and Affect: Mood normal. Affect is tearful.        Speech: Speech normal.        Thought Content: Thought content does not include suicidal plan.        Cognition and Memory: Cognition normal.     Wt Readings from Last 3 Encounters:  03/13/21 237 lb (107.5 kg)  09/23/20 232 lb (105.2 kg)  04/11/20 234 lb (106.1 kg)    BP 124/84   Pulse 87   Temp 98.2 F (36.8 C) (Oral)   Ht 5\' 4"  (1.626 m)   Wt 237 lb (107.5 kg)   LMP 03/06/2021   SpO2 98%   BMI 40.68 kg/m   Assessment and Plan: 1.  Annual physical exam Exam is normal except for weight. Encourage regular exercise and appropriate dietary changes. - CBC with Differential/Platelet - Comprehensive metabolic panel - Lipid panel - TSH  2. Encounter for screening mammogram for breast cancer Up to date  3. Menstrual migraine without status migrainosus, not intractable Intermittent headaches respond to triptan therapy - almotriptan (AXERT) 12.5 MG tablet; Take 1 tablet (12.5 mg total) by mouth as needed for migraine. may repeat in 2 hours if needed  Dispense: 12 tablet; Refill: 5  4. Need for hepatitis C screening test - Hepatitis C antibody  5. Mood disorder ([place]) Continue counseling Mood continues to be an issue and is negatively impacting her quality of life. Start Lexapro - 5 mg x 4 days then 10 mg per day Follow up in 6 weeks - sooner if needed - escitalopram (LEXAPRO) 10 MG tablet; Take 1 tablet (10 mg total) by mouth daily.  Dispense: 30 tablet; Refill: 1   Partially dictated using Editor, commissioning. Any errors are unintentional.  Halina Maidens, MD [facility]  03/13/2021

## 2021-03-13 ENCOUNTER — Encounter: Payer: Self-pay | Admitting: Internal Medicine

## 2021-03-13 ENCOUNTER — Ambulatory Visit (INDEPENDENT_AMBULATORY_CARE_PROVIDER_SITE_OTHER): Payer: Managed Care, Other (non HMO) | Admitting: Internal Medicine

## 2021-03-13 ENCOUNTER — Other Ambulatory Visit: Payer: Self-pay

## 2021-03-13 VITALS — BP 124/84 | HR 87 | Temp 98.2°F | Ht 64.0 in | Wt 237.0 lb

## 2021-03-13 DIAGNOSIS — G43829 Menstrual migraine, not intractable, without status migrainosus: Secondary | ICD-10-CM

## 2021-03-13 DIAGNOSIS — Z1231 Encounter for screening mammogram for malignant neoplasm of breast: Secondary | ICD-10-CM | POA: Diagnosis not present

## 2021-03-13 DIAGNOSIS — Z1159 Encounter for screening for other viral diseases: Secondary | ICD-10-CM

## 2021-03-13 DIAGNOSIS — F39 Unspecified mood [affective] disorder: Secondary | ICD-10-CM

## 2021-03-13 DIAGNOSIS — Z0000 Encounter for general adult medical examination without abnormal findings: Secondary | ICD-10-CM

## 2021-03-13 MED ORDER — ESCITALOPRAM OXALATE 10 MG PO TABS: 10.0000 mg | ORAL_TABLET | Freq: Every day | ORAL | 1 refills | Status: DC

## 2021-03-13 MED ORDER — ALMOTRIPTAN MALATE 12.5 MG PO TABS: 12.5000 mg | ORAL_TABLET | ORAL | 5 refills | Status: DC | PRN

## 2021-03-17 ENCOUNTER — Other Ambulatory Visit: Payer: Self-pay | Admitting: Sports Medicine

## 2021-03-17 DIAGNOSIS — M7541 Impingement syndrome of right shoulder: Secondary | ICD-10-CM

## 2021-03-17 DIAGNOSIS — M25511 Pain in right shoulder: Secondary | ICD-10-CM

## 2021-03-17 DIAGNOSIS — G8929 Other chronic pain: Secondary | ICD-10-CM

## 2021-03-21 ENCOUNTER — Telehealth: Payer: Self-pay

## 2021-03-21 NOTE — Telephone Encounter (Signed)
Called patient and left VM reminding her to get her labs drawn from her recent appt at [place].

## 2021-03-25 ENCOUNTER — Other Ambulatory Visit: Payer: Self-pay | Admitting: Surgery

## 2021-03-27 ENCOUNTER — Ambulatory Visit
Admission: RE | Admit: 2021-03-27 | Discharge: 2021-03-27 | Disposition: A | Discharge status: Home / Self Care | Payer: Managed Care, Other (non HMO) | Source: Ambulatory Visit | Attending: Sports Medicine | Admitting: Sports Medicine

## 2021-03-27 ENCOUNTER — Other Ambulatory Visit: Payer: Self-pay

## 2021-03-27 DIAGNOSIS — G8929 Other chronic pain: Secondary | ICD-10-CM | POA: Diagnosis present

## 2021-03-27 DIAGNOSIS — M25511 Pain in right shoulder: Secondary | ICD-10-CM | POA: Diagnosis present

## 2021-03-27 DIAGNOSIS — M7541 Impingement syndrome of right shoulder: Secondary | ICD-10-CM | POA: Insufficient documentation

## 2021-04-01 ENCOUNTER — Other Ambulatory Visit
Admission: RE | Admit: 2021-04-01 | Discharge: 2021-04-01 | Disposition: A | Discharge status: Home / Self Care | Payer: Managed Care, Other (non HMO) | Source: Ambulatory Visit | Attending: Surgery | Admitting: Surgery

## 2021-04-01 ENCOUNTER — Other Ambulatory Visit: Payer: Self-pay

## 2021-04-01 HISTORY — DX: Other specified postprocedural states: R11.2

## 2021-04-01 HISTORY — DX: Other specified postprocedural states: Z98.890

## 2021-04-01 HISTORY — DX: Depression, unspecified: F32.A

## 2021-04-01 LAB — CBC WITH DIFFERENTIAL/PLATELET
Basophils Absolute: 0 10*3/uL (ref 0.0–0.2)
Basos: 1 %
EOS (ABSOLUTE): 0.2 10*3/uL (ref 0.0–0.4)
Eos: 4 %
Hematocrit: 39.1 % (ref 34.0–46.6)
Hemoglobin: 12.8 g/dL (ref 11.1–15.9)
Immature Grans (Abs): 0 10*3/uL (ref 0.0–0.1)
Immature Granulocytes: 0 %
Lymphocytes Absolute: 1.6 10*3/uL (ref 0.7–3.1)
Lymphs: 26 %
MCH: 27.4 pg (ref 26.6–33.0)
MCHC: 32.7 g/dL (ref 31.5–35.7)
MCV: 84 fL (ref 79–97)
Monocytes Absolute: 0.5 10*3/uL (ref 0.1–0.9)
Monocytes: 8 %
Neutrophils Absolute: 3.7 10*3/uL (ref 1.4–7.0)
Neutrophils: 61 %
Platelets: 211 10*3/uL (ref 150–450)
RBC: 4.67 x10E6/uL (ref 3.77–5.28)
RDW: 14.3 % (ref 11.7–15.4)
WBC: 6 10*3/uL (ref 3.4–10.8)

## 2021-04-01 LAB — COMPREHENSIVE METABOLIC PANEL
ALT: 16 IU/L (ref 0–32)
AST: 16 IU/L (ref 0–40)
Albumin/Globulin Ratio: 1.4 (ref 1.2–2.2)
Albumin: 4.1 g/dL (ref 3.8–4.8)
Alkaline Phosphatase: 57 IU/L (ref 44–121)
BUN/Creatinine Ratio: 18 (ref 9–23)
BUN: 12 mg/dL (ref 6–24)
Bilirubin Total: 0.3 mg/dL (ref 0.0–1.2)
CO2: 19 mmol/L — ABNORMAL LOW (ref 20–29)
Calcium: 9 mg/dL (ref 8.7–10.2)
Chloride: 105 mmol/L (ref 96–106)
Creatinine, Ser: 0.67 mg/dL (ref 0.57–1.00)
Globulin, Total: 2.9 g/dL (ref 1.5–4.5)
Glucose: 90 mg/dL (ref 65–99)
Potassium: 4.2 mmol/L (ref 3.5–5.2)
Sodium: 139 mmol/L (ref 134–144)
Total Protein: 7 g/dL (ref 6.0–8.5)
eGFR: 113 mL/min/{1.73_m2} (ref >59)

## 2021-04-01 LAB — LIPID PANEL
Chol/HDL Ratio: 4 ratio (ref 0.0–4.4)
Cholesterol, Total: 157 mg/dL (ref 100–199)
HDL: 39 mg/dL — ABNORMAL LOW (ref >39)
LDL Chol Calc (NIH): 95 mg/dL (ref 0–99)
Triglycerides: 125 mg/dL (ref 0–149)
VLDL Cholesterol Cal: 23 mg/dL (ref 5–40)

## 2021-04-01 LAB — HEPATITIS C ANTIBODY: Hep C Virus Ab: 0.2 s/co ratio (ref 0.0–0.9)

## 2021-04-01 LAB — TSH: TSH: 3.1 u[IU]/mL (ref 0.450–4.500)

## 2021-04-01 NOTE — Patient Instructions (Addendum)
Your procedure is scheduled on:  Wednesday, April 27  Report to the Registration Desk on the 1st floor of the [facility]. To find out your arrival time, please call [phone] between 1PM - 3PM on: Tuesday, April 26  REMEMBER: Instructions that are not followed completely may result in serious medical risk, up to and including death; or upon the discretion of your surgeon and anesthesiologist your surgery may need to be rescheduled.  Do not eat food after midnight the night before surgery.  No gum chewing, lozengers or hard candies.  You may however, drink CLEAR liquids up to 2 hours before you are scheduled to arrive for your surgery. Do not drink anything within 2 hours of your scheduled arrival time.  Clear liquids include: - water  - apple juice without pulp - gatorade (not RED, PURPLE, OR BLUE) - black coffee or tea (Do NOT add milk or creamers to the coffee or tea) Do NOT drink anything that is not on this list.  In addition, your doctor has ordered for you to drink the provided  Ensure Pre-Surgery Clear Carbohydrate Drink  Drinking this carbohydrate drink up to two hours before surgery helps to reduce insulin resistance and improve patient outcomes. Please complete drinking 2 hours prior to scheduled arrival time.  TAKE THESE MEDICATIONS THE MORNING OF SURGERY WITH A SIP OF WATER:  1.  Tylenol if needed for pain  One week prior to surgery: starting April 20 Stop diclofenac, Anti-inflammatories (NSAIDS) such as Advil, Aleve, Ibuprofen, Motrin, Naproxen, Naprosyn and Aspirin based products such as Excedrin, Goodys Powder, BC Powder. Stop ANY OVER THE COUNTER supplements until after surgery.  No Alcohol for 24 hours before or after surgery.  No Smoking including e-cigarettes for 24 hours prior to surgery.  No chewable tobacco products for at least 6 hours prior to surgery.  No nicotine patches on the day of surgery.  Do not use any "recreational" drugs for at least a  week prior to your surgery.  Please be advised that the combination of cocaine and anesthesia may have negative outcomes, up to and including death. If you test positive for cocaine, your surgery will be cancelled.  On the morning of surgery brush your teeth with toothpaste and water, you may rinse your mouth with mouthwash if you wish. Do not swallow any toothpaste or mouthwash.  Do not wear jewelry, make-up, hairpins, clips or nail polish.  Do not wear lotions, powders, or perfumes.   Do not shave body from the neck down 48 hours prior to surgery just in case you cut yourself which could leave a site for infection.  Also, freshly shaved skin may become irritated if using the CHG soap.  Contact lenses, hearing aids and dentures may not be worn into surgery.  Do not bring valuables to the hospital. [facility] is not responsible for any missing/lost belongings or valuables.   Use CHG Soap as directed on instruction sheet.  Notify your doctor if there is any change in your medical condition (cold, fever, infection).  Wear comfortable clothing (specific to your surgery type) to the hospital.  Plan for stool softeners for home use; pain medications have a tendency to cause constipation. You can also help prevent constipation by eating foods high in fiber such as fruits and vegetables and drinking plenty of fluids as your diet allows.  After surgery, you can help prevent lung complications by doing breathing exercises.  Take deep breaths and cough every 1-2 hours. Your doctor  may order a device called an Incentive Spirometer to help you take deep breaths.  If you are being discharged the day of surgery, you will not be allowed to drive home. You will need a responsible adult (18 years or older) to drive you home and stay with you that night.   If you are taking public transportation, you will need to have a responsible adult (18 years or older) with you. Please confirm with your  physician that it is acceptable to use public transportation.   Please call the [place] Dept. at [phone] if you have any questions about these instructions.  Surgery Visitation Policy:  Patients undergoing a surgery or procedure may have one family member or support person with them as long as that person is not COVID-19 positive or experiencing its symptoms.  That person may remain in the waiting area during the procedure.

## 2021-04-07 ENCOUNTER — Other Ambulatory Visit
Admission: RE | Admit: 2021-04-07 | Discharge: 2021-04-07 | Disposition: A | Discharge status: Home / Self Care | Payer: Managed Care, Other (non HMO) | Source: Ambulatory Visit | Attending: Surgery | Admitting: Surgery

## 2021-04-07 ENCOUNTER — Other Ambulatory Visit: Payer: Self-pay

## 2021-04-07 DIAGNOSIS — Z20822 Contact with and (suspected) exposure to covid-19: Secondary | ICD-10-CM | POA: Insufficient documentation

## 2021-04-07 DIAGNOSIS — Z01812 Encounter for preprocedural laboratory examination: Secondary | ICD-10-CM | POA: Insufficient documentation

## 2021-04-08 LAB — SARS CORONAVIRUS 2 (TAT 6-24 HRS): SARS Coronavirus 2: NEGATIVE

## 2021-04-08 MED ORDER — FAMOTIDINE 20 MG PO TABS: 20.0000 mg | ORAL_TABLET | Freq: Once | ORAL | Status: AC

## 2021-04-08 MED ORDER — ORAL CARE MOUTH RINSE: 15.0000 mL | Freq: Once | OROMUCOSAL | Status: AC

## 2021-04-08 MED ORDER — CEFAZOLIN SODIUM-DEXTROSE 2-4 GM/100ML-% IV SOLN
2.0000 g | INTRAVENOUS | Status: AC
  Administered 2021-04-09: 2 g via INTRAVENOUS

## 2021-04-08 MED ORDER — LACTATED RINGERS IV SOLN: INTRAVENOUS | Status: DC

## 2021-04-08 MED ORDER — CHLORHEXIDINE GLUCONATE 0.12 % MT SOLN: 15.0000 mL | Freq: Once | OROMUCOSAL | Status: AC

## 2021-04-09 ENCOUNTER — Ambulatory Visit
Admission: RE | Admit: 2021-04-09 | Discharge: 2021-04-09 | Disposition: A | Discharge status: Home / Self Care | Payer: Managed Care, Other (non HMO) | Attending: Surgery | Admitting: Surgery

## 2021-04-09 ENCOUNTER — Ambulatory Visit: Payer: Managed Care, Other (non HMO) | Admitting: Certified Registered"

## 2021-04-09 ENCOUNTER — Other Ambulatory Visit: Payer: Self-pay

## 2021-04-09 ENCOUNTER — Encounter
Admission: RE | Disposition: A | Discharge status: Home / Self Care | Payer: Self-pay | Source: Home / Self Care | Attending: Surgery

## 2021-04-09 ENCOUNTER — Encounter: Payer: Self-pay | Admitting: Surgery

## 2021-04-09 DIAGNOSIS — D2111 Benign neoplasm of connective and other soft tissue of right upper limb, including shoulder: Secondary | ICD-10-CM | POA: Insufficient documentation

## 2021-04-09 DIAGNOSIS — L720 Epidermal cyst: Secondary | ICD-10-CM | POA: Insufficient documentation

## 2021-04-09 DIAGNOSIS — Z881 Allergy status to other antibiotic agents status: Secondary | ICD-10-CM | POA: Insufficient documentation

## 2021-04-09 DIAGNOSIS — R229 Localized swelling, mass and lump, unspecified: Secondary | ICD-10-CM | POA: Diagnosis present

## 2021-04-09 DIAGNOSIS — Z888 Allergy status to other drugs, medicaments and biological substances status: Secondary | ICD-10-CM | POA: Insufficient documentation

## 2021-04-09 HISTORY — PX: GANGLION CYST EXCISION: SHX1691

## 2021-04-09 LAB — POCT PREGNANCY, URINE: Preg Test, Ur: NEGATIVE

## 2021-04-09 SURGERY — EXCISION, GANGLION CYST, WRIST
Anesthesia: Monitor Anesthesia Care | Site: Middle Finger | Laterality: Right

## 2021-04-09 MED ORDER — LIDOCAINE HCL (PF) 1 % IJ SOLN
INTRAMUSCULAR | Status: AC
  Filled 2021-04-09: qty 30

## 2021-04-09 MED ORDER — ONDANSETRON HCL 4 MG/2ML IJ SOLN: 4.0000 mg | Freq: Once | INTRAMUSCULAR | Status: DC | PRN

## 2021-04-09 MED ORDER — ONDANSETRON HCL 4 MG/2ML IJ SOLN: 4.0000 mg | Freq: Four times a day (QID) | INTRAMUSCULAR | Status: DC | PRN

## 2021-04-09 MED ORDER — MIDAZOLAM HCL 2 MG/2ML IJ SOLN
INTRAMUSCULAR | Status: DC | PRN
  Administered 2021-04-09: 2 mg via INTRAVENOUS

## 2021-04-09 MED ORDER — PROPOFOL 10 MG/ML IV BOLUS
INTRAVENOUS | Status: AC
  Filled 2021-04-09: qty 20

## 2021-04-09 MED ORDER — METOCLOPRAMIDE HCL 10 MG PO TABS
5.0000 mg | ORAL_TABLET | Freq: Three times a day (TID) | ORAL | Status: DC | PRN
Start: 2021-04-09 — End: 2021-04-09

## 2021-04-09 MED ORDER — PROPOFOL 10 MG/ML IV BOLUS
INTRAVENOUS | Status: DC | PRN
  Administered 2021-04-09: 20 mg via INTRAVENOUS
  Administered 2021-04-09: 60 mg via INTRAVENOUS

## 2021-04-09 MED ORDER — BUPIVACAINE HCL (PF) 0.5 % IJ SOLN
INTRAMUSCULAR | Status: AC
  Filled 2021-04-09: qty 30

## 2021-04-09 MED ORDER — FENTANYL CITRATE (PF) 100 MCG/2ML IJ SOLN
INTRAMUSCULAR | Status: AC
  Filled 2021-04-09: qty 2

## 2021-04-09 MED ORDER — LIDOCAINE HCL 1 % IJ SOLN
INTRAMUSCULAR | Status: DC | PRN
  Administered 2021-04-09: 5 mL

## 2021-04-09 MED ORDER — HYDROCODONE-ACETAMINOPHEN 5-325 MG PO TABS: 1.0000 | ORAL_TABLET | Freq: Four times a day (QID) | ORAL | 0 refills | Status: DC | PRN

## 2021-04-09 MED ORDER — FENTANYL CITRATE (PF) 100 MCG/2ML IJ SOLN
25.0000 ug | INTRAMUSCULAR | Status: DC | PRN
Start: 2021-04-09 — End: 2021-04-09

## 2021-04-09 MED ORDER — CHLORHEXIDINE GLUCONATE 0.12 % MT SOLN
OROMUCOSAL | Status: AC
  Administered 2021-04-09: 15 mL via OROMUCOSAL
  Filled 2021-04-09: qty 15

## 2021-04-09 MED ORDER — FAMOTIDINE 20 MG PO TABS
ORAL_TABLET | ORAL | Status: AC
  Administered 2021-04-09: 20 mg via ORAL
  Filled 2021-04-09: qty 1

## 2021-04-09 MED ORDER — PROPOFOL 500 MG/50ML IV EMUL
INTRAVENOUS | Status: AC
  Filled 2021-04-09: qty 50

## 2021-04-09 MED ORDER — LIDOCAINE HCL (PF) 2 % IJ SOLN
INTRAMUSCULAR | Status: AC
  Filled 2021-04-09: qty 5

## 2021-04-09 MED ORDER — ONDANSETRON HCL 4 MG PO TABS: 4.0000 mg | ORAL_TABLET | Freq: Four times a day (QID) | ORAL | Status: DC | PRN

## 2021-04-09 MED ORDER — METOCLOPRAMIDE HCL 5 MG/ML IJ SOLN: 5.0000 mg | Freq: Three times a day (TID) | INTRAMUSCULAR | Status: DC | PRN

## 2021-04-09 MED ORDER — CEFAZOLIN SODIUM-DEXTROSE 2-4 GM/100ML-% IV SOLN
INTRAVENOUS | Status: AC
  Filled 2021-04-09: qty 100

## 2021-04-09 MED ORDER — HYDROCODONE-ACETAMINOPHEN 5-325 MG PO TABS: 1.0000 | ORAL_TABLET | ORAL | Status: DC | PRN

## 2021-04-09 MED ORDER — POTASSIUM CHLORIDE IN NACL 20-0.9 MEQ/L-% IV SOLN
INTRAVENOUS | Status: DC
  Filled 2021-04-09 (×3): qty 1000

## 2021-04-09 MED ORDER — BUPIVACAINE HCL (PF) 0.5 % IJ SOLN
INTRAMUSCULAR | Status: DC | PRN
  Administered 2021-04-09: 5 mL

## 2021-04-09 MED ORDER — MIDAZOLAM HCL 2 MG/2ML IJ SOLN
INTRAMUSCULAR | Status: AC
  Filled 2021-04-09: qty 2

## 2021-04-09 SURGICAL SUPPLY — 27 items
APL PRP STRL LF DISP 70% ISPRP (MISCELLANEOUS) ×1
BNDG COHESIVE 4X5 TAN STRL (GAUZE/BANDAGES/DRESSINGS) IMPLANT
BNDG ELASTIC 2X5.8 VLCR STR LF (GAUZE/BANDAGES/DRESSINGS) ×2 IMPLANT
BNDG ESMARK 4X12 TAN STRL LF (GAUZE/BANDAGES/DRESSINGS) ×2 IMPLANT
CANISTER SUCT 1200ML W/VALVE (MISCELLANEOUS) IMPLANT
CHLORAPREP W/TINT 26 (MISCELLANEOUS) ×2 IMPLANT
CORD BIP STRL DISP 12FT (MISCELLANEOUS) ×2 IMPLANT
COVER WAND RF STERILE (DRAPES) ×2 IMPLANT
CUFF TOURN SGL QUICK 18X4 (TOURNIQUET CUFF) ×2 IMPLANT
FORCEPS JEWEL BIP 4-3/4 STR (INSTRUMENTS) ×2 IMPLANT
GAUZE SPONGE 4X4 12PLY STRL (GAUZE/BANDAGES/DRESSINGS) IMPLANT
GAUZE XEROFORM 1X8 LF (GAUZE/BANDAGES/DRESSINGS) ×2 IMPLANT
GLOVE SURG ENC MOIS LTX SZ8 (GLOVE) ×2 IMPLANT
GLOVE SURG UNDER LTX SZ8 (GLOVE) ×2 IMPLANT
GOWN STRL REUS W/ TWL XL LVL3 (GOWN DISPOSABLE) ×1 IMPLANT
GOWN STRL REUS W/TWL XL LVL3 (GOWN DISPOSABLE) ×2
KIT TURNOVER KIT A (KITS) ×2 IMPLANT
MANIFOLD NEPTUNE II (INSTRUMENTS) ×2 IMPLANT
NS IRRIG 500ML POUR BTL (IV SOLUTION) ×2 IMPLANT
PACK EXTREMITY ARMC (MISCELLANEOUS) ×2 IMPLANT
SPLINT WRIST LG LT TX990309 (SOFTGOODS) IMPLANT
SPLINT WRIST LG RT TX900304 (SOFTGOODS) IMPLANT
SPLINT WRIST M LT TX990308 (SOFTGOODS) IMPLANT
SPLINT WRIST M RT TX990303 (SOFTGOODS) IMPLANT
SPONGE GAUZE 2X2 8PLY STRL LF (GAUZE/BANDAGES/DRESSINGS) ×2 IMPLANT
STOCKINETTE IMPERVIOUS 9X36 MD (GAUZE/BANDAGES/DRESSINGS) ×2 IMPLANT
SUT PROLENE 4 0 PS 2 18 (SUTURE) ×2 IMPLANT

## 2021-04-09 NOTE — Discharge Instructions (Addendum)
Orthopedic discharge instructions: Keep dressing dry and intact. Keep hand elevated above heart level. May shower after dressing removed on postop day 4 (Sunday). Cover sutures with Band-Aids after drying off. Apply ice to affected area frequently. Take Voltaren 75 mg BID OR ibuprofen 600-800 mg TID with meals for 7-10 days, then as necessary. Take ES Tylenol or pain medication as prescribed when needed.  Return for follow-up in 10-14 days or as scheduled.  AMBULATORY SURGERY  DISCHARGE INSTRUCTIONS   1) The drugs that you were given will stay in your system until tomorrow so for the next 24 hours you should not:  A) Drive an automobile B) Make any legal decisions C) Drink any alcoholic beverage   2) You may resume regular meals tomorrow.  Today it is better to start with liquids and gradually work up to solid foods.  You may eat anything you prefer, but it is better to start with liquids, then soup and crackers, and gradually work up to solid foods.   3) Please notify your doctor immediately if you have any unusual bleeding, trouble breathing, redness and pain at the surgery site, drainage, fever, or pain not relieved by medication.        Please contact your physician with any problems or Same Day Surgery at [phone], Monday through Friday 6 am to 4 pm, or [place] at [facility] number at [phone].

## 2021-04-09 NOTE — Anesthesia Preprocedure Evaluation (Signed)
Anesthesia Evaluation  Patient identified by MRN, date of birth, ID band Patient awake    Reviewed: Allergy & Precautions, H&P , NPO status , Patient's Chart, lab work & pertinent test results, reviewed documented beta blocker date and time   History of Anesthesia Complications (+) PONV and history of anesthetic complications  Airway Mallampati: II  TM Distance: >3 FB Neck ROM: full    Dental  (+) Teeth Intact   Pulmonary neg pulmonary ROS,    Pulmonary exam normal        Cardiovascular Exercise Tolerance: Good negative cardio ROS Normal cardiovascular exam Rate:Normal     Neuro/Psych  Headaches, PSYCHIATRIC DISORDERS Depression    GI/Hepatic Neg liver ROS, GERD  Medicated,  Endo/Other  Morbid obesity  Renal/GU negative Renal ROS  negative genitourinary   Musculoskeletal   Abdominal   Peds  Hematology negative hematology ROS (+)   Anesthesia Other Findings   Reproductive/Obstetrics negative OB ROS                             Anesthesia Physical Anesthesia Plan  ASA: III  Anesthesia Plan: Bier Block and Bier Block-Lidocaine Only   Post-op Pain Management:    Induction:   PONV Risk Score and Plan: 3  Airway Management Planned:   Additional Equipment:   Intra-op Plan:   Post-operative Plan:   Informed Consent: I have reviewed the patients History and Physical, chart, labs and discussed the procedure including the risks, benefits and alternatives for the proposed anesthesia with the patient or authorized representative who has indicated his/her understanding and acceptance.       Plan Discussed with: CRNA  Anesthesia Plan Comments:         Anesthesia Quick Evaluation

## 2021-04-09 NOTE — Transfer of Care (Signed)
Immediate Anesthesia Transfer of Care Note  Patient: Dawn Cain  Procedure(s) Performed: EXCISION OF MUCOID CYST FROM RIGHT LONG FINGER (Right Middle Finger)  Patient Location: PACU  Anesthesia Type:MAC  Level of Consciousness: awake, alert  and oriented  Airway & Oxygen Therapy: Patient Spontanous Breathing  Post-op Assessment: Report given to RN and Post -op Vital signs reviewed and stable  Post vital signs: Reviewed and stable  Last Vitals:  Vitals Value Taken Time  BP 125/85   Temp 36.2   Pulse 73 04/09/21 0817  Resp 18 04/09/21 0817  SpO2 98 % 04/09/21 0817  Vitals shown include unvalidated device data.  Last Pain:  Vitals:   04/09/21 0627  TempSrc: Oral  PainSc: 5          Complications: No complications documented.

## 2021-04-09 NOTE — H&P (Signed)
History of Present Illness: Dawn Cain is a 40 y.o. female who presents today for evaluation of a cyst present to the patient's right middle finger. The patient believes that the cyst has been present since the summer of last year however she states that the cyst has begun to increase in size. She denies any injury or trauma affecting the right hand. The patient does work at [employer] and does use her hands for activities throughout the day. She is right-hand dominant. Denies any surgical history to the right hand. Denies any numbness or tingling. She denies any pain directly over the cyst but does state that when she is using her hands and accidentally hits the cyst it does cause increased pain. This is located along the ulnar aspect of the right middle finger at the DIP joint. No evidence of infection such as open wound or drainage from the area. The patient denies any personal history of heart attack, stroke, asthma or COPD.  Past Medical History: . Endometriosis 02/27/2014  . GERD (gastroesophageal reflux disease)  . Infertility, female, unspecified  . Lipoma of back  . Miscarriage 03/23/2011   Past Surgical History: . achilles tendon surgery Right 04/09/2017  . FOOT SURGERY Right 03/2017  . heel spur surgery 03/2017  . lipoma excision Left 10/31/2015  . ostectomy R foot 04/09/2017  . ovarian surgery   Past Family History: . No Known Problems Mother  . High blood pressure (Hypertension) Father  . Lung cancer Maternal Grandfather  . Heart disease Paternal Grandmother  . Stomach cancer Paternal Grandfather   Medications: . almotriptan (AXERT) 12.5 MG tablet Take 12.5 mg by mouth once as needed for Migraine. If headache returns, may take a second dose after 2 hours.  . multivitamin (MULTIVITAMIN) tablet Take by mouth.   Allergies: . Erythromycin Other (Esophagus inflammation and difficultly breathing)  . Sumatriptan Other (neck muscle tightness)   Review of Systems:  A comprehensive  14 point ROS was performed, reviewed by me today, and the pertinent orthopaedic findings are documented in the HPI.  Physical Exam: There were no vitals taken for this visit. General/Constitutional: The patient appears to be well-nourished, well-developed, and in no acute distress. Neuro/Psych: Normal mood and affect, oriented to person, place and time. Eyes: Non-icteric. Pupils are equal, round, and reactive to light, and exhibit synchronous movement. ENT: Unremarkable. Lymphatic: No palpable adenopathy. Respiratory: Lungs clear to auscultation, Normal chest excursion, No wheezes and Non-labored breathing Cardiovascular: Regular rate and rhythm. No murmurs. and No edema, swelling or tenderness, except as noted in detailed exam. Integumentary: No impressive skin lesions present, except as noted in detailed exam. Musculoskeletal: Unremarkable, except as noted in detailed exam.  Skin examination of the right hand does reveal a small cyst along the ulnar aspect of the right middle finger just distal to the DIP joint. The cyst appears to be approximately 8 mm in diameter. No significant tenderness with palpation over the cyst site. There is no erythema, ecchymosis or open wound in the area. Does feel cystic in nature. The patient does have full flexion extension at both the DIP and PIP joints. Full strength with resisted extension of the right middle finger, full strength with testing of the FDP and FDS tendons. There is no angulation or rotation of the digit. No significant swelling of the right middle finger. The patient is intact light touch throughout the right upper extremity. Cap refills intact each individual digit. Radial pulses intact to the right hand.  Imaging: AP, lateral, oblique  images of the right middle finger were obtained today in the office and reviewed by me. These x-rays do not demonstrate any evidence of acute fracture, lytic lesion or dislocation. The DIP and PIP joints are  well-maintained without loss of joint space. MCP joint intact without loss of joint space. No acute abnormality is identified. No significant soft tissue swelling can be seen.  Impression: Mucous cyst of right long finger.  Plan:  1. Treatment options were discussed today with the patient. 2. Patient has what appears to be a mucoid cyst off of the ulnar aspect of the DIP joint to the right middle finger at today's visit. 3. Spoke with the patient about ultrasound-guided aspiration versus surgical removal, discussed the risk and benefits of both. Patient would like to proceed with surgical removal of the right middle finger cyst. 4. Surgery will be scheduled with Dr. Roland Rack in the future. This document will serve as a surgical history and physical. 5. The patient will follow-up per standard postop protocol. They can call the clinic they have any questions, new symptoms develop or symptoms worsen.  The procedure was discussed with the patient, as were the potential risks (including bleeding, infection, nerve and/or blood vessel injury, persistent or recurrent pain, recurrence of the cyst, need for further surgery, blood clots, strokes, heart attacks and/or arhythmias, pneumonia, etc.) and benefits. The patient states her understanding and wishes to proceed.   H&P reviewed and patient re-examined. No changes.

## 2021-04-09 NOTE — Op Note (Signed)
04/09/2021  8:18 AM  Patient:   Dawn Cain  Pre-Op Diagnosis:   Soft tissue mass, right long finger.  Post-Op Diagnosis:   Same  Procedure:   Excision of soft tissue mass, right long finger.  Surgeon:   Pascal Lux, MD  Assistant:   Marijean Bravo, PA-S  Anesthesia:   Digital block with IV sedation  Findings:   As above.  The mass appeared to be well-circumscribed and caseated.  Complications:   None  Fluids:   300 cc crystalloid  EBL:   0 cc  UOP:   None  TT:   Digital tourniquet for approximately 10 minutes  Drains:   None  Closure:   4-0 Prolene interrupted sutures  Brief Clinical Note:   The patient is a 40 year old female with a history of a slowly enlarging and progressively painful mass on the volar ulnar aspect of her right long fingertip.  Her history and examination are consistent with a probable mucoid cyst.  The patient presents at this time for excision of the soft tissue mass of the right long fingertip.  Procedure:   The patient was brought into the operating room and lain in the supine position.  A timeout was performed to verify the appropriate surgical site.  After adequate IV sedation was achieved, a digital block was placed around the right long fingertip.  The digital block was performed sterilely using 5 cc of 0.5% Sensorcaine and 5 cc of 1% lidocaine.  The right hand and upper extremity were prepped with ChloraPrep solution before being draped sterilely.  Preoperative antibiotics were administered.  And approximately 1 to 1.5 cm incision was made longitudinally along the ulnar aspect of the ring finger tip centered over the mass.  The incision was carried down into the subcutaneous tissues where the mass was encountered immediately.  The mass was quite mobile and was able to be dissected out from the surrounding soft tissues easily.  The mass was removed in its entirety and sent to pathology for formal identification.  While removing the mass, a  small amount of caseous material was expressed.  The wound was thoroughly irrigated with sterile saline solution before the wound was closed using 4-0 Prolene interrupted sutures.  A sterile bulky dressing was applied to the finger before the patient was awakened and returned to the recovery room in satisfactory condition after tolerating the procedure well.

## 2021-04-10 ENCOUNTER — Encounter: Payer: Self-pay | Admitting: Surgery

## 2021-04-10 LAB — SURGICAL PATHOLOGY

## 2021-04-10 NOTE — Anesthesia Postprocedure Evaluation (Signed)
Anesthesia Post Note  Patient: Dawn Cain  Procedure(s) Performed: EXCISION OF MUCOID CYST FROM RIGHT LONG FINGER (Right Middle Finger)  Patient location during evaluation: PACU Anesthesia Type: MAC Level of consciousness: awake and alert Pain management: pain level controlled Vital Signs Assessment: post-procedure vital signs reviewed and stable Respiratory status: spontaneous breathing, nonlabored ventilation, respiratory function stable and patient connected to nasal cannula oxygen Cardiovascular status: blood pressure returned to baseline and stable Postop Assessment: no apparent nausea or vomiting Anesthetic complications: no   No complications documented.   Last Vitals:  Vitals:   04/09/21 0835 04/09/21 0845  BP: (!) 123/50 120/68  Pulse: 72 72  Resp: (!) 24 18  Temp: 36.7 C 36.5 C  SpO2: 100% 100%    Last Pain:  Vitals:   04/09/21 0845  TempSrc: Temporal  PainSc: 0-No pain                 Molli Barrows

## 2021-04-23 ENCOUNTER — Ambulatory Visit: Payer: Managed Care, Other (non HMO) | Admitting: Internal Medicine

## 2021-05-29 ENCOUNTER — Other Ambulatory Visit: Payer: Self-pay | Admitting: Sports Medicine

## 2021-05-29 DIAGNOSIS — M25561 Pain in right knee: Secondary | ICD-10-CM

## 2021-05-29 DIAGNOSIS — M25461 Effusion, right knee: Secondary | ICD-10-CM

## 2021-05-29 DIAGNOSIS — M7121 Synovial cyst of popliteal space [Baker], right knee: Secondary | ICD-10-CM

## 2021-06-18 ENCOUNTER — Other Ambulatory Visit: Payer: Self-pay

## 2021-06-18 ENCOUNTER — Ambulatory Visit
Admission: RE | Admit: 2021-06-18 | Discharge: 2021-06-18 | Disposition: A | Discharge status: Home / Self Care | Payer: Managed Care, Other (non HMO) | Source: Ambulatory Visit | Attending: Sports Medicine | Admitting: Sports Medicine

## 2021-06-18 DIAGNOSIS — M25561 Pain in right knee: Secondary | ICD-10-CM | POA: Insufficient documentation

## 2021-06-18 DIAGNOSIS — M25461 Effusion, right knee: Secondary | ICD-10-CM | POA: Diagnosis present

## 2021-06-18 DIAGNOSIS — M7121 Synovial cyst of popliteal space [Baker], right knee: Secondary | ICD-10-CM | POA: Insufficient documentation

## 2021-07-01 ENCOUNTER — Other Ambulatory Visit: Payer: Self-pay | Admitting: Orthopedic Surgery

## 2021-07-09 ENCOUNTER — Encounter: Payer: Self-pay | Admitting: Orthopedic Surgery

## 2021-07-18 ENCOUNTER — Encounter: Payer: Self-pay | Admitting: Orthopedic Surgery

## 2021-07-18 ENCOUNTER — Encounter
Admission: RE | Disposition: A | Discharge status: Home / Self Care | Payer: Self-pay | Source: Home / Self Care | Attending: Orthopedic Surgery

## 2021-07-18 ENCOUNTER — Other Ambulatory Visit: Payer: Self-pay

## 2021-07-18 ENCOUNTER — Ambulatory Visit: Payer: Managed Care, Other (non HMO) | Admitting: Anesthesiology

## 2021-07-18 ENCOUNTER — Ambulatory Visit
Admission: RE | Admit: 2021-07-18 | Discharge: 2021-07-18 | Disposition: A | Discharge status: Home / Self Care | Payer: Managed Care, Other (non HMO) | Attending: Orthopedic Surgery | Admitting: Orthopedic Surgery

## 2021-07-18 DIAGNOSIS — Z791 Long term (current) use of non-steroidal anti-inflammatories (NSAID): Secondary | ICD-10-CM | POA: Diagnosis not present

## 2021-07-18 DIAGNOSIS — Z881 Allergy status to other antibiotic agents status: Secondary | ICD-10-CM | POA: Insufficient documentation

## 2021-07-18 DIAGNOSIS — Z79899 Other long term (current) drug therapy: Secondary | ICD-10-CM | POA: Diagnosis not present

## 2021-07-18 DIAGNOSIS — Z888 Allergy status to other drugs, medicaments and biological substances status: Secondary | ICD-10-CM | POA: Diagnosis not present

## 2021-07-18 DIAGNOSIS — S83241A Other tear of medial meniscus, current injury, right knee, initial encounter: Secondary | ICD-10-CM | POA: Diagnosis not present

## 2021-07-18 DIAGNOSIS — X58XXXA Exposure to other specified factors, initial encounter: Secondary | ICD-10-CM | POA: Diagnosis not present

## 2021-07-18 HISTORY — PX: KNEE ARTHROSCOPY WITH MENISCAL REPAIR: SHX5653

## 2021-07-18 LAB — POCT PREGNANCY, URINE: Preg Test, Ur: NEGATIVE

## 2021-07-18 SURGERY — ARTHROSCOPY, KNEE, WITH MENISCUS REPAIR
Anesthesia: General | Site: Knee | Laterality: Right

## 2021-07-18 MED ORDER — OXYCODONE HCL 5 MG PO TABS: 5.0000 mg | ORAL_TABLET | ORAL | 0 refills | Status: DC | PRN

## 2021-07-18 MED ORDER — OXYCODONE HCL 5 MG/5ML PO SOLN: 5.0000 mg | Freq: Once | ORAL | Status: AC | PRN

## 2021-07-18 MED ORDER — ASPIRIN EC 325 MG PO TBEC: 325.0000 mg | DELAYED_RELEASE_TABLET | Freq: Every day | ORAL | 0 refills | Status: AC

## 2021-07-18 MED ORDER — OXYCODONE HCL 5 MG PO TABS
5.0000 mg | ORAL_TABLET | Freq: Once | ORAL | Status: AC | PRN
  Administered 2021-07-18: 5 mg via ORAL

## 2021-07-18 MED ORDER — DEXAMETHASONE SODIUM PHOSPHATE 4 MG/ML IJ SOLN
INTRAMUSCULAR | Status: DC | PRN
Start: 2021-07-18 — End: 2021-07-18
  Administered 2021-07-18: 4 mg via INTRAVENOUS

## 2021-07-18 MED ORDER — KETOROLAC TROMETHAMINE 15 MG/ML IJ SOLN
15.0000 mg | Freq: Once | INTRAMUSCULAR | Status: AC
  Administered 2021-07-18: 15 mg via INTRAVENOUS

## 2021-07-18 MED ORDER — ONDANSETRON HCL 4 MG/2ML IJ SOLN
INTRAMUSCULAR | Status: DC | PRN
  Administered 2021-07-18: 4 mg via INTRAVENOUS

## 2021-07-18 MED ORDER — ONDANSETRON 4 MG PO TBDP: 4.0000 mg | ORAL_TABLET | Freq: Three times a day (TID) | ORAL | 0 refills | Status: DC | PRN

## 2021-07-18 MED ORDER — CEFAZOLIN SODIUM-DEXTROSE 2-4 GM/100ML-% IV SOLN
2.0000 g | INTRAVENOUS | Status: AC
  Administered 2021-07-18: 2 g via INTRAVENOUS

## 2021-07-18 MED ORDER — ACETAMINOPHEN 10 MG/ML IV SOLN
1000.0000 mg | Freq: Once | INTRAVENOUS | Status: DC
Start: 2021-07-18 — End: 2021-07-18

## 2021-07-18 MED ORDER — FENTANYL CITRATE PF 50 MCG/ML IJ SOSY
25.0000 ug | PREFILLED_SYRINGE | INTRAMUSCULAR | Status: DC | PRN
  Administered 2021-07-18 (×2): 25 ug via INTRAVENOUS

## 2021-07-18 MED ORDER — LACTATED RINGERS IV SOLN: INTRAVENOUS | Status: DC

## 2021-07-18 MED ORDER — ACETAMINOPHEN 325 MG PO TABS: 325.0000 mg | ORAL_TABLET | ORAL | Status: DC | PRN

## 2021-07-18 MED ORDER — ACETAMINOPHEN 160 MG/5ML PO SOLN: 325.0000 mg | ORAL | Status: DC | PRN

## 2021-07-18 MED ORDER — ACETAMINOPHEN 500 MG PO TABS: 1000.0000 mg | ORAL_TABLET | Freq: Three times a day (TID) | ORAL | 2 refills | Status: AC

## 2021-07-18 MED ORDER — FENTANYL CITRATE (PF) 100 MCG/2ML IJ SOLN
INTRAMUSCULAR | Status: DC | PRN
  Administered 2021-07-18 (×2): 50 ug via INTRAVENOUS

## 2021-07-18 MED ORDER — LIDOCAINE-EPINEPHRINE 1 %-1:100000 IJ SOLN
INTRAMUSCULAR | Status: DC | PRN
  Administered 2021-07-18: 4 mL
  Administered 2021-07-18: 6 mL

## 2021-07-18 MED ORDER — ACETAMINOPHEN 10 MG/ML IV SOLN
1000.0000 mg | Freq: Once | INTRAVENOUS | Status: AC
  Administered 2021-07-18: 1000 mg via INTRAVENOUS

## 2021-07-18 MED ORDER — MIDAZOLAM HCL 5 MG/5ML IJ SOLN
INTRAMUSCULAR | Status: DC | PRN
  Administered 2021-07-18: 2 mg via INTRAVENOUS

## 2021-07-18 MED ORDER — GLYCOPYRROLATE 0.2 MG/ML IJ SOLN
INTRAMUSCULAR | Status: DC | PRN
  Administered 2021-07-18 (×2): .1 mg via INTRAVENOUS

## 2021-07-18 MED ORDER — IBUPROFEN 800 MG PO TABS: 800.0000 mg | ORAL_TABLET | Freq: Three times a day (TID) | ORAL | 1 refills | Status: AC

## 2021-07-18 MED ORDER — PROPOFOL 10 MG/ML IV BOLUS
INTRAVENOUS | Status: DC | PRN
  Administered 2021-07-18: 100 mg via INTRAVENOUS
  Administered 2021-07-18: 200 mg via INTRAVENOUS

## 2021-07-18 SURGICAL SUPPLY — 49 items
ADAPTER IRRIG TUBE 2 SPIKE SOL (ADAPTER) ×4 IMPLANT
ADH SKN CLS APL DERMABOND .7 (GAUZE/BANDAGES/DRESSINGS) ×1
ADPR TBG 2 SPK PMP STRL ASCP (ADAPTER) ×2
APL PRP STRL LF DISP 70% ISPRP (MISCELLANEOUS) ×1
BLADE SURG 15 STRL LF DISP TIS (BLADE) ×1 IMPLANT
BLADE SURG 15 STRL SS (BLADE) ×2
BLADE SURG SZ11 CARB STEEL (BLADE) ×2 IMPLANT
BNDG ESMARK 6X12 TAN STRL LF (GAUZE/BANDAGES/DRESSINGS) ×2 IMPLANT
BUR RADIUS 3.5 (BURR) ×2 IMPLANT
BUR RADIUS 4.0X18.5 (BURR) ×2 IMPLANT
CHLORAPREP W/TINT 26 (MISCELLANEOUS) ×2 IMPLANT
COOLER POLAR GLACIER W/PUMP (MISCELLANEOUS) ×2 IMPLANT
COVER LIGHT HANDLE UNIVERSAL (MISCELLANEOUS) ×4 IMPLANT
CUFF TOURN SGL QUICK 34 (TOURNIQUET CUFF) ×2
CUFF TRNQT CYL 34X4.125X (TOURNIQUET CUFF) ×1 IMPLANT
DERMABOND ADVANCED (GAUZE/BANDAGES/DRESSINGS) ×1
DERMABOND ADVANCED .7 DNX12 (GAUZE/BANDAGES/DRESSINGS) ×1 IMPLANT
DRAPE IMP U-DRAPE 54X76 (DRAPES) ×2 IMPLANT
GAUZE SPONGE 4X4 12PLY STRL (GAUZE/BANDAGES/DRESSINGS) ×2 IMPLANT
GLOVE SURG ENC MOIS LTX SZ7.5 (GLOVE) ×4 IMPLANT
GOWN STRL REUS W/ TWL LRG LVL3 (GOWN DISPOSABLE) ×1 IMPLANT
GOWN STRL REUS W/TWL LRG LVL3 (GOWN DISPOSABLE) ×2
IMP SYS 2ND FIX PEEK 4.75X19.1 (Miscellaneous) ×2 IMPLANT
IMPL SYS 2ND FX PEEK 4.75X19.1 (Miscellaneous) ×1 IMPLANT
IMPL SYS MENISCAL ROOT REPAIR (Orthopedic Implant) ×2 IMPLANT
IV LACTATED RINGER IRRG 3000ML (IV SOLUTION) ×8
IV LR IRRIG 3000ML ARTHROMATIC (IV SOLUTION) ×4 IMPLANT
KIT TURNOVER KIT A (KITS) ×2 IMPLANT
MANIFOLD NEPTUNE II (INSTRUMENTS) ×2 IMPLANT
MAT ABSORB  FLUID 56X50 GRAY (MISCELLANEOUS) ×2
MAT ABSORB FLUID 56X50 GRAY (MISCELLANEOUS) ×2 IMPLANT
PACK ARTHROSCOPY KNEE (MISCELLANEOUS) ×2 IMPLANT
PAD WRAPON POLAR KNEE (MISCELLANEOUS) ×1 IMPLANT
PADDING CAST BLEND 6X4 STRL (MISCELLANEOUS) ×1 IMPLANT
PADDING STRL CAST 6IN (MISCELLANEOUS) ×1
PENCIL SMOKE EVACUATOR (MISCELLANEOUS) ×2 IMPLANT
POSITIONER HEAD DONUT 9IN (MISCELLANEOUS) ×2 IMPLANT
SET TUBE SUCT SHAVER OUTFL 24K (TUBING) ×2 IMPLANT
SUT ETHILON 3 0 FSLX (SUTURE) ×2 IMPLANT
SUT ETHILON 3-0 (SUTURE) ×2 IMPLANT
SUT FIBERWIRE 2-0 18 17.9 3/8 (SUTURE) ×2
SUT MNCRL 4-0 (SUTURE) ×2
SUT MNCRL 4-0 27XMFL (SUTURE) ×1
SUT VIC AB 2-0 CT2 27 (SUTURE) ×2 IMPLANT
SUTURE FIBERWR 2-0 18 17.9 3/8 (SUTURE) ×1 IMPLANT
SUTURE MNCRL 4-0 27XMF (SUTURE) ×1 IMPLANT
TUBING ARTHRO INFLOW-ONLY STRL (TUBING) ×2 IMPLANT
WAND WEREWOLF FLOW 90D (MISCELLANEOUS) ×2 IMPLANT
WRAPON POLAR PAD KNEE (MISCELLANEOUS) ×2

## 2021-07-18 NOTE — Anesthesia Procedure Notes (Signed)
Procedure Name: LMA Insertion Date/Time: 07/18/2021 10:16 AM Performed by: Silvana Newness, CRNA Pre-anesthesia Checklist: Patient identified, Emergency Drugs available, Suction available, Patient being monitored and Timeout performed Patient Re-evaluated:Patient Re-evaluated prior to induction Oxygen Delivery Method: Circle system utilized Preoxygenation: Pre-oxygenation with 100% oxygen Induction Type: IV induction Ventilation: Mask ventilation without difficulty LMA: LMA inserted LMA Size: 4.0 Number of attempts: 1 Placement Confirmation: positive ETCO2 and breath sounds checked- equal and bilateral Tube secured with: Tape Dental Injury: Teeth and Oropharynx as per pre-operative assessment

## 2021-07-18 NOTE — Op Note (Addendum)
DATE: 07/18/2021   PRE-OP DIAGNOSIS:  1. Right medial meniscus root tear 2. Right Medial compartment and patellofemoral compartment degenerative changes   POST-OP DIAGNOSIS:  1. Right medial meniscus root tear 2. Right Medial compartment and patellofemoral compartment degenerative changes  PROCEDURES:  1. Right knee arthroscopy, medial meniscus root repair  2. Right knee arthroscopic medial femoral condyle and patellofemoral chondroplasty     SURGEON:  Langston Reusing, MD   ASSISTANT(S):  Reche Dixon, [place]   ANESTHESIA: Gen   TOTAL IV FLUIDS: See anesthesia record   ESTIMATED BLOOD LOSS: 5cc   TOURNIQUET TIME:  57 min   DRAINS:  None.   SPECIMENS: None   IMPLANTS:  - Arthrex Biocomposite SwiveLock (x1)     COMPLICATIONS: None   INDICATIONS: Dawn Cain is a 40 y.o. female with R knee pain that has failed non-operative management for over 3 months. Clinical exam and radiographic studies were notable for left knee pain and swelling with instances of giving way with associated popping and catching on the medial aspect of her knee. Additionally, MRI showed a medial meniscus root tear with mild degenerative changes to medial and patellofemoral compartments. Given the poor long-term prognosis of a meniscus root tear and high likelihood of significant progression of osteoarthritis, we elected to proceed with the above procedure after a discussion of the risks, benefits, and alternatives to surgery.    OPERATIVE FINDINGS:    Examination under anesthesia: A careful examination under anesthesia was performed.  Passive range of motion was: Hyperextension: 2.  Extension: 0.  Flexion: 125.  Lachman: normal. Pivot Shift: normal.  Posterior drawer: normal.  Varus stability in full extension: normal.  Varus stability in 30 degrees of flexion: normal.  Valgus stability in full extension: normal.  Valgus stability in 30 degrees of flexion: normal.   Intra-operative findings: A thorough  arthroscopic examination of the knee was performed.  The findings are: 1. Suprapatellar pouch: Normal 2. Undersurface of median ridge: Focal Grade 2-3 degenerative changes 3. Medial patellar facet: Grade 1 degenerative changes 4. Lateral patellar facet: Grade 1 degenerative changes 5. Trochlea: Normal 6. Lateral gutter/popliteus tendon: Normal 7. Hoffa's fat pad: Inflamed 8. Medial gutter/plica: Normal 9. ACL: Normal 10. PCL: Normal 11. Medial meniscus: Partial tear of the medial meniscus at the posterior root with instability of the meniscus 12. Medial compartment cartilage: diffuse Grade 2-3 degenerative changes on MFC, grade 1-2 changes on tibial plateau 13. Lateral meniscus: Normal 14. Lateral compartment cartilage: Normal  DESCRIPTION OF PROCEDURE: I identified Deedra Ehrich in the pre-operative holding area.  I marked the operative knee with my initials. I reviewed the risks and benefits of the proposed surgical intervention and the patient (and/or patient's guardian) wished to proceed. The patient was transferred to the operative suite and placed in the supine position with all bony prominences padded.  Anesthesia was administered. Appropriate IV antibiotics were administered prior to incision. The extremity was then prepped and draped in standard fashion. A time out was performed confirming the correct extremity, correct patient, and correct procedure.   Arthroscopy portals were marked. Local anesthetic was injected to the planned portal sites. The anterolateral portal was established with an 11 blade. The arthroscope was placed in the anterolateral portal and then into the suprapatellar pouch.  A diagnostic knee scope was completed with the above findings. Next the medial portal was established under needle localization. The MCL was pie-crusted to improve visualization of the posterior horn. The meniscus root tear was identified  and probed to confirm our findings.  As described above, it  was a high-grade partial tear at the root with instability of the meniscus.  Therefore, decision was made to complete the tear and perform a meniscus root repair.  The tear was completed with a straight biter. Next, an Arthrex meniscus root aiming guide was used to mark out the tibial incision. An approximately 4cm vertical incision was made medial to the tibial tubercle. This was carried down to the sartorius fascia with bovie electrocautery, and the fascia was incised. An elevator was used to clear periosteum from the tibia in the area of the anticipated bone tunnel. Hemostasis was achieved. The guide was reinserted into the tibia and was placed over the anatomic footprint of the medial meniscus root. We then used a 6.71m FlipCutter to drill into the tibia and create a 624msocket for the meniscus root. A FiberStick was passed through our tibial tunnel and into the joint. This was retrieved and passed through the anterolateral portal.   Next, using a Meniscus Scorpion, an 0-FiberLink suture was passed just medial to the meniscus root in a luggage tag configuration.  A second stitch was passed in a similar fashion medial to the first stitch.  This allowed for excellent purchase of the meniscus root.  We ensured there was no soft tissue bridge between the sutures and pulled the passing stitch from the [employer] the anteromedial portal. We then used the passing stitch to bring the sutures in the meniscus out through the tibial tunnel. These were then passed through an [employer]. Anchor hole was drilled ~3cm distal previously drilled tibial tunnel. Anchor was inserted with appropriate tension while visualizing the repair with the arthroscope, and this achieved excellent interference fit. The meniscus root was probed and found to be stable.   Any loose bony debris was removed from the knee joint with a shaver and excess fluid was evacuated from the joint. Closure of the portals with 3-0 Nylon  was performed. The inferior portion of the sartorius fascia was closed 0 vicryl. The subdermal layer of the tibial incision was closed with 2-0 vicryl and the skin was closed with 4-0 Monocryl in a running fashion and Dermabond.  Xeroform gauze and dry sterile dressings were applied. A PolarCare and hinged knee brace were also applied.   Instrument, sponge, and needle counts were correct prior to wound closure and at the conclusion of the case.   Of note, assistance from a PA was essential to performing the surgery.  PA was present for the entire surgery.  PA assisted with patient positioning, retraction, instrumentation, and wound closure. The surgery would have been more difficult and had longer operative time without PA assistance.   Additionally, this case had increased complexity compared to standard meniscus repair given that the patient had a tear of the meniscus root. Repair of this tear involved making a separate open incision, drilling a tunnel through the tibia, and using an implant in the tibia for fixation, all of which would otherwise not occur for standard meniscal repairs.  The steps increased surgical time by approximately 20 minutes.  DISPOSITION: PACU - hemodynamically stable.    POSTOPERATIVE PLAN: The patient will be discharged home today.     Non-weight bearing x 4 weeks. 50% WB from weeks 4-6. ASA for DVT ppx x 4 weeks, Narcotic medication, NSAID, and acetaminophen as discussed pre-operatively. The patient will be attending physical therapy beginning 3-5 days post-op. Physical therapy per Meniscus Root  Repair Rehab Guidelines.   Patient to return to clinic 10-14 days postop for suture removal.

## 2021-07-18 NOTE — Anesthesia Preprocedure Evaluation (Signed)
Anesthesia Evaluation  Patient identified by MRN, date of birth, ID band Patient awake    Reviewed: Allergy & Precautions, NPO status   History of Anesthesia Complications (+) PONV  Airway Mallampati: II  TM Distance: >3 FB     Dental   Pulmonary    Pulmonary exam normal        Cardiovascular negative cardio ROS   Rhythm:Regular Rate:Normal     Neuro/Psych  Headaches, PSYCHIATRIC DISORDERS Depression    GI/Hepatic GERD  Controlled,  Endo/Other  Obesity - BMI >30  Renal/GU      Musculoskeletal  (+) Arthritis ,   Abdominal   Peds  Hematology   Anesthesia Other Findings   Reproductive/Obstetrics                             Anesthesia Physical Anesthesia Plan  ASA: 2  Anesthesia Plan: General   Post-op Pain Management:    Induction: Intravenous  PONV Risk Score and Plan: Treatment may vary due to age or medical condition  Airway Management Planned: LMA  Additional Equipment:   Intra-op Plan:   Post-operative Plan:   Informed Consent: I have reviewed the patients History and Physical, chart, labs and discussed the procedure including the risks, benefits and alternatives for the proposed anesthesia with the patient or authorized representative who has indicated his/her understanding and acceptance.     Dental advisory given  Plan Discussed with: CRNA  Anesthesia Plan Comments:         Anesthesia Quick Evaluation

## 2021-07-18 NOTE — Transfer of Care (Signed)
Immediate Anesthesia Transfer of Care Note  Patient: Dawn Cain  Procedure(s) Performed: Right knee arthroscopic medial meniscus root repair and chondroplasty (Right: Knee)  Patient Location: PACU  Anesthesia Type: General  Level of Consciousness: awake, alert  and patient cooperative  Airway and Oxygen Therapy: Patient Spontanous Breathing and Patient connected to supplemental oxygen  Post-op Assessment: Post-op Vital signs reviewed, Patient's Cardiovascular Status Stable, Respiratory Function Stable, Patent Airway and No signs of Nausea or vomiting  Post-op Vital Signs: Reviewed and stable  Complications: No notable events documented.

## 2021-07-18 NOTE — Anesthesia Postprocedure Evaluation (Signed)
Anesthesia Post Note  Patient: Dawn Cain  Procedure(s) Performed: Right knee arthroscopic medial meniscus root repair and chondroplasty (Right: Knee)     Patient location during evaluation: PACU Anesthesia Type: General Level of consciousness: awake Pain management: pain level controlled Vital Signs Assessment: post-procedure vital signs reviewed and stable Respiratory status: respiratory function stable Cardiovascular status: stable Postop Assessment: no signs of nausea or vomiting Anesthetic complications: no   No notable events documented.  Veda Canning

## 2021-07-18 NOTE — Discharge Instructions (Signed)
Arthroscopic Knee Surgery - Meniscus Repair   Post-Op Instructions   1. Bracing or crutches: Crutches will be provided at the time of discharge from the surgery center. Keep brace locked in extension at all times except as directed by physical therapy.    2. Ice: You may be provided with a device [facility]) that allows you to ice the affected area effectively. Otherwise you can ice manually.    3. Driving:  Plan on not driving for at least four weeks. Please note that you are advised NOT to drive while taking narcotic pain medications as you may be impaired and unsafe to drive.   4. Activity: Ankle pumps several times an hour while awake to prevent blood clots. Weight bearing: NO WEIGHT BEARING FOR 4 WEEKS. Use crutches for at least 4 weeks, if not 6 based on your surgery. Bending and straightening the knee is unlimited, but do not flex your knee past 90 degrees until cleared by your therapist. Elevate knee above heart level as much as possible for one week. Avoid standing more than 5 minutes (consecutively) for the first week. No exercise involving the knee until cleared by the surgeon or physical therapist.  Avoid long distance travel for 4 weeks.   5. Medications:  - You have been provided a prescription for narcotic pain medicine. After surgery, take 1-2 narcotic tablets every 4 hours if needed for severe pain. If it has tylenol (acetaminophen), please do not take a total of more than '3000mg'$ /day of tylenol.  - A prescription for anti-nausea medication will be provided in case the narcotic medicine causes nausea - take 1 tablet every 6 hours only if nauseated.  - Take ibuprofen 800 mg every 8 hours with food to reduce post-operative knee swelling.  - Take enteric coated aspirin 325 mg once daily for 4 weeks to prevent blood clots.  -Take tylenol 1000 every 8 hours for pain.  May stop tylenol 3 days after surgery or when you are having minimal pain. If your narcotic has tylenol (acetaminophen),  please do not take a total of more than '3000mg'$ /day of tylenol.    If you are taking prescription medication for anxiety, depression, insomnia, muscle spasm, chronic pain, or for attention deficit disorder you are advised that you are at a higher risk of adverse effects with use of narcotics post-op, including narcotic addiction/dependence, depressed breathing, death. If you use non-prescribed substances: alcohol, marijuana, cocaine, heroin, methamphetamines, etc., you are at a higher risk of adverse effects with use of narcotics post-op, including narcotic addiction/dependence, depressed breathing, death. You are advised that taking > 50 morphine milligram equivalents (MME) of narcotic pain medication per day results in twice the risk of overdose or death. For your prescription provided: oxycodone 5 mg - taking more than 6 tablets per day. Be advised that we will prescribe narcotics short-term, for acute post-operative pain only - 1 week for minor operations such as knee arthroscopy for meniscus tear resection, and 3 weeks for major operations such as knee repair/reconstruction surgeries.   6. Bandages: The physical therapist should change the bandages at the first post-op appointment. If needed, the dressing supplies have been provided to you. You may shower after this with waterproof bandaids covering the incisions.    7. Physical Therapy: 2 times per week for the first 4 weeks, then 1-2 times per week from weeks 4-8 post-op. Therapy typically starts on post operative Day 3 or 4. You have been provided an order for physical therapy. The therapist  will provide home exercises.   8. Work: May return to full work when off of crutches. May do light duty/desk job in approximately 1-2 weeks when off of narcotics, pain is well-controlled, and swelling has decreased.   9. Post-Op Appointments: Your first post-op appointment will be with Dr. Posey Pronto in approximately 2 weeks time.    If you find that they have  not been scheduled please call the Orthopaedic Appointment front desk at [phone].

## 2021-07-18 NOTE — H&P (Signed)
Paper H&P to be scanned into permanent record. H&P reviewed. No significant changes noted.  

## 2021-07-21 ENCOUNTER — Encounter: Payer: Self-pay | Admitting: Orthopedic Surgery

## 2021-09-10 ENCOUNTER — Other Ambulatory Visit: Payer: Self-pay | Admitting: Orthopedic Surgery

## 2021-09-10 ENCOUNTER — Encounter: Payer: Self-pay | Admitting: Orthopedic Surgery

## 2021-09-16 ENCOUNTER — Ambulatory Visit: Payer: Managed Care, Other (non HMO) | Admitting: Anesthesiology

## 2021-09-16 ENCOUNTER — Encounter: Payer: Self-pay | Admitting: Orthopedic Surgery

## 2021-09-16 ENCOUNTER — Encounter
Admission: RE | Disposition: A | Discharge status: Home / Self Care | Payer: Self-pay | Source: Home / Self Care | Attending: Orthopedic Surgery

## 2021-09-16 ENCOUNTER — Ambulatory Visit
Admission: RE | Admit: 2021-09-16 | Discharge: 2021-09-16 | Disposition: A | Discharge status: Home / Self Care | Payer: Managed Care, Other (non HMO) | Attending: Orthopedic Surgery | Admitting: Orthopedic Surgery

## 2021-09-16 DIAGNOSIS — M1711 Unilateral primary osteoarthritis, right knee: Secondary | ICD-10-CM | POA: Insufficient documentation

## 2021-09-16 DIAGNOSIS — S83241A Other tear of medial meniscus, current injury, right knee, initial encounter: Secondary | ICD-10-CM | POA: Diagnosis not present

## 2021-09-16 DIAGNOSIS — Z888 Allergy status to other drugs, medicaments and biological substances status: Secondary | ICD-10-CM | POA: Diagnosis not present

## 2021-09-16 DIAGNOSIS — Z881 Allergy status to other antibiotic agents status: Secondary | ICD-10-CM | POA: Diagnosis not present

## 2021-09-16 DIAGNOSIS — X58XXXA Exposure to other specified factors, initial encounter: Secondary | ICD-10-CM | POA: Insufficient documentation

## 2021-09-16 DIAGNOSIS — M24661 Ankylosis, right knee: Secondary | ICD-10-CM | POA: Insufficient documentation

## 2021-09-16 HISTORY — PX: KNEE CLOSED REDUCTION: SHX995

## 2021-09-16 SURGERY — MANIPULATION, KNEE, CLOSED
Anesthesia: General | Site: Knee | Laterality: Right

## 2021-09-16 MED ORDER — OXYCODONE HCL 5 MG/5ML PO SOLN: 5.0000 mg | Freq: Once | ORAL | Status: AC | PRN

## 2021-09-16 MED ORDER — FENTANYL CITRATE (PF) 100 MCG/2ML IJ SOLN
INTRAMUSCULAR | Status: DC | PRN
  Administered 2021-09-16: 50 ug via INTRAVENOUS
  Administered 2021-09-16 (×3): 25 ug via INTRAVENOUS

## 2021-09-16 MED ORDER — OXYCODONE HCL 5 MG PO TABS
5.0000 mg | ORAL_TABLET | Freq: Once | ORAL | Status: AC | PRN
  Administered 2021-09-16: 5 mg via ORAL

## 2021-09-16 MED ORDER — LIDOCAINE HCL (CARDIAC) PF 100 MG/5ML IV SOSY
PREFILLED_SYRINGE | INTRAVENOUS | Status: DC | PRN
  Administered 2021-09-16: 50 mg via INTRATRACHEAL

## 2021-09-16 MED ORDER — HYDROCODONE-ACETAMINOPHEN 5-325 MG PO TABS: 1.0000 | ORAL_TABLET | ORAL | 0 refills | Status: DC | PRN

## 2021-09-16 MED ORDER — SCOPOLAMINE 1 MG/3DAYS TD PT72
1.0000 | MEDICATED_PATCH | Freq: Once | TRANSDERMAL | Status: DC
  Administered 2021-09-16: 1.5 mg via TRANSDERMAL

## 2021-09-16 MED ORDER — ONDANSETRON HCL 4 MG/2ML IJ SOLN
INTRAMUSCULAR | Status: DC | PRN
  Administered 2021-09-16: 4 mg via INTRAVENOUS

## 2021-09-16 MED ORDER — DEXAMETHASONE SODIUM PHOSPHATE 4 MG/ML IJ SOLN
INTRAMUSCULAR | Status: DC | PRN
  Administered 2021-09-16: 4 mg via INTRAVENOUS

## 2021-09-16 MED ORDER — LIDOCAINE-EPINEPHRINE 1 %-1:100000 IJ SOLN
INTRAMUSCULAR | Status: DC | PRN
  Administered 2021-09-16: 4 mL via INTRAMUSCULAR
  Administered 2021-09-16: 5 mL via INTRAMUSCULAR

## 2021-09-16 MED ORDER — ONDANSETRON 4 MG PO TBDP: 4.0000 mg | ORAL_TABLET | Freq: Three times a day (TID) | ORAL | 0 refills | Status: DC | PRN

## 2021-09-16 MED ORDER — LACTATED RINGERS IV SOLN: INTRAVENOUS | Status: DC

## 2021-09-16 MED ORDER — LACTATED RINGERS IR SOLN
Status: DC | PRN
  Administered 2021-09-16: 6000 mL
  Administered 2021-09-16: 9000 mL

## 2021-09-16 MED ORDER — CEFAZOLIN SODIUM-DEXTROSE 2-4 GM/100ML-% IV SOLN
2.0000 g | INTRAVENOUS | Status: AC
  Administered 2021-09-16: 2 g via INTRAVENOUS

## 2021-09-16 MED ORDER — GLYCOPYRROLATE 0.2 MG/ML IJ SOLN
INTRAMUSCULAR | Status: DC | PRN
  Administered 2021-09-16: .1 mg via INTRAVENOUS

## 2021-09-16 MED ORDER — PROMETHAZINE HCL 25 MG/ML IJ SOLN: 6.2500 mg | INTRAMUSCULAR | Status: DC | PRN

## 2021-09-16 MED ORDER — KETOROLAC TROMETHAMINE 15 MG/ML IJ SOLN
15.0000 mg | Freq: Once | INTRAMUSCULAR | Status: AC
  Administered 2021-09-16: 15 mg via INTRAVENOUS

## 2021-09-16 MED ORDER — PROPOFOL 10 MG/ML IV BOLUS
INTRAVENOUS | Status: DC | PRN
  Administered 2021-09-16: 200 mg via INTRAVENOUS

## 2021-09-16 MED ORDER — MIDAZOLAM HCL 5 MG/5ML IJ SOLN
INTRAMUSCULAR | Status: DC | PRN
  Administered 2021-09-16: 2 mg via INTRAVENOUS

## 2021-09-16 MED ORDER — IBUPROFEN 800 MG PO TABS: 800.0000 mg | ORAL_TABLET | Freq: Three times a day (TID) | ORAL | 1 refills | Status: AC

## 2021-09-16 MED ORDER — TRIAMCINOLONE ACETONIDE 40 MG/ML IJ SUSP
INTRAMUSCULAR | Status: DC | PRN
Start: 2021-09-16 — End: 2021-09-16
  Administered 2021-09-16: 40 mg

## 2021-09-16 MED ORDER — ASPIRIN EC 325 MG PO TBEC: 325.0000 mg | DELAYED_RELEASE_TABLET | Freq: Every day | ORAL | 0 refills | Status: AC

## 2021-09-16 MED ORDER — ACETAMINOPHEN 500 MG PO TABS: 1000.0000 mg | ORAL_TABLET | Freq: Three times a day (TID) | ORAL | 2 refills | Status: DC

## 2021-09-16 MED ORDER — HYDROMORPHONE HCL 1 MG/ML IJ SOLN
0.2500 mg | INTRAMUSCULAR | Status: DC | PRN
  Administered 2021-09-16 (×2): 0.5 mg via INTRAVENOUS

## 2021-09-16 SURGICAL SUPPLY — 43 items
APL PRP STRL LF DISP 70% ISPRP (MISCELLANEOUS) ×1
BLADE FULL RADIUS 3.5 (BLADE) IMPLANT
BLADE SHAVER 4.5X7 STR FR (MISCELLANEOUS) ×1 IMPLANT
BLADE SURG 15 STRL LF DISP TIS (BLADE) ×1 IMPLANT
BLADE SURG 15 STRL SS (BLADE)
BLADE SURG SZ11 CARB STEEL (BLADE) ×2 IMPLANT
BNDG COHESIVE 4X5 TAN ST LF (GAUZE/BANDAGES/DRESSINGS) ×2 IMPLANT
BNDG ESMARK 6X12 TAN STRL LF (GAUZE/BANDAGES/DRESSINGS) ×2 IMPLANT
BRACE KNEE POST OP SHORT (BRACE) IMPLANT
BUR RADIUS 4.0X18.5 (BURR) IMPLANT
CHLORAPREP W/TINT 26 (MISCELLANEOUS) ×2 IMPLANT
COOLER POLAR GLACIER W/PUMP (MISCELLANEOUS) ×2 IMPLANT
COVER LIGHT HANDLE UNIVERSAL (MISCELLANEOUS) ×4 IMPLANT
DRAPE IMP U-DRAPE 54X76 (DRAPES) ×2 IMPLANT
GAUZE SPONGE 4X4 12PLY STRL (GAUZE/BANDAGES/DRESSINGS) ×2 IMPLANT
GLOVE SRG 8 PF TXTR STRL LF DI (GLOVE) IMPLANT
GLOVE SURG ENC MOIS LTX SZ7.5 (GLOVE) ×4 IMPLANT
GLOVE SURG UNDER POLY LF SZ8 (GLOVE) ×2
GOWN STRL REUS W/ TWL LRG LVL3 (GOWN DISPOSABLE) ×1 IMPLANT
GOWN STRL REUS W/TWL LRG LVL3 (GOWN DISPOSABLE) ×2
IV LACTATED RINGER IRRG 3000ML (IV SOLUTION) ×10
IV LR IRRIG 3000ML ARTHROMATIC (IV SOLUTION) ×4 IMPLANT
KIT TURNOVER KIT A (KITS) ×2 IMPLANT
MANIFOLD NEPTUNE II (INSTRUMENTS) ×2 IMPLANT
MAT ABSORB  FLUID 56X50 GRAY (MISCELLANEOUS) ×1
MAT ABSORB FLUID 56X50 GRAY (MISCELLANEOUS) ×2 IMPLANT
PACK ARTHROSCOPY KNEE (MISCELLANEOUS) ×2 IMPLANT
PAD WRAPON POLAR KNEE (MISCELLANEOUS) ×1 IMPLANT
PADDING CAST BLEND 6X4 STRL (MISCELLANEOUS) ×1 IMPLANT
PADDING STRL CAST 6IN (MISCELLANEOUS) ×1
SUT ETHILON 3 0 FSLX (SUTURE) ×1 IMPLANT
SUT ETHILON 3-0 FS-10 30 BLK (SUTURE) ×2
SUT FIBERWIRE 2-0 18 17.9 3/8 (SUTURE)
SUT VIC AB 0 CT1 27 (SUTURE)
SUT VIC AB 0 CT1 27XCR 8 STRN (SUTURE) IMPLANT
SUT VIC AB 2-0 CT1 27 (SUTURE)
SUT VIC AB 2-0 CT1 TAPERPNT 27 (SUTURE) IMPLANT
SUTURE EHLN 3-0 FS-10 30 BLK (SUTURE) IMPLANT
SUTURE FIBERWR 2-0 18 17.9 3/8 (SUTURE) IMPLANT
TUBING INFLOW SET DBFLO PUMP (TUBING) ×2 IMPLANT
TUBING OUTFLOW SET DBLFO PUMP (TUBING) ×2 IMPLANT
WAND WEREWOLF FLOW 90D (MISCELLANEOUS) ×2 IMPLANT
WRAPON POLAR PAD KNEE (MISCELLANEOUS) ×2

## 2021-09-16 NOTE — Transfer of Care (Signed)
Immediate Anesthesia Transfer of Care Note  Patient: Dawn Cain  Procedure(s) Performed: Right knee arthroscopy, lysis of adhesions, partial medial menisectomy and manipulation under anesthesia (Right: Knee)  Patient Location: PACU  Anesthesia Type: General  Level of Consciousness: awake, alert  and patient cooperative  Airway and Oxygen Therapy: Patient Spontanous Breathing and Patient connected to supplemental oxygen  Post-op Assessment: Post-op Vital signs reviewed, Patient's Cardiovascular Status Stable, Respiratory Function Stable, Patent Airway and No signs of Nausea or vomiting  Post-op Vital Signs: Reviewed and stable  Complications: No notable events documented.

## 2021-09-16 NOTE — Op Note (Signed)
Operative Note    SURGERY DATE: 09/16/2021   PRE-OP DIAGNOSIS:  1. Right knee arthrofibrosis   POST-OP DIAGNOSIS:  1. Right knee arthrofibrosis 2. Right knee medial meniscus re-tear   PROCEDURES:  1. Right knee arthroscopy, lysis of adhesions 2. Right knee arthroscopic partial medial meniscectomy 3. Right knee manipulation under anesthesia   SURGEON: Cato Mulligan, MD   ANESTHESIA: Gen   ESTIMATED BLOOD LOSS: minimal   TOTAL IV FLUIDS: per anesthesia   INDICATION(S):  KAIDYN HERNANDES is a 40 y.o. female s/p R knee medial meniscus root repair on 07/18/2021 who developed post-operative knee arthrofibrosis. The patient was unable to make continued progress with range of motion with physical therapy. Therefore, after discussion of risks, benefits, and alternatives to surgery, the patient elected to proceed.   OPERATIVE FINDINGS:    Examination under anesthesia: A careful examination under anesthesia was performed.  Passive range of motion was: Hyperextension: 2.  Extension: 0.  Flexion: 75.  Lachman: normal. Pivot Shift: normal.  Posterior drawer: normal.  Varus stability in full extension: normal.  Varus stability in 30 degrees of flexion: normal.  Valgus stability in full extension: normal.  Valgus stability in 30 degrees of flexion: normal.   Intra-operative findings: A thorough arthroscopic examination of the knee was performed.  The findings are: 1. Suprapatellar pouch: Decreased volume of pouch with significant scar formation 2. Undersurface of median ridge: Focal Grade 2-3 degenerative changes 3. Medial patellar facet: Grade 1 degenerative changes 4. Lateral patellar facet: Grade 1 degenerative changes 5. Trochlea: Normal 6. Lateral gutter/popliteus tendon: Normal except significant scar formation 7. Hoffa's fat pad: Inflamed and thickened 8. Medial gutter/plica: Normal except significant scar formation 9. ACL: Normal except significant scar formation anteriorly in notch 10.  PCL: Normal 11. Medial meniscus: Retear of the posterior horn meniscus root with prior suture cut through the meniscus 12. Medial compartment cartilage: diffuse Grade 2-3 degenerative changes on MFC, grade 1-2 changes on tibial plateau 13. Lateral meniscus: Normal 14. Lateral compartment cartilage: Normal   OPERATIVE REPORT:     I identified Deedra Ehrich in the pre-operative holding area. I marked the operative knee with my initials. I reviewed the risks and benefits of the proposed surgical intervention and the patient (and/or patient's guardian) wished to proceed. The patient was transferred to the operative suite and placed in the supine position with all bony prominences padded.  Anesthesia was administered. Appropriate IV antibiotics were administered prior to incision. The extremity was then prepped and draped in standard fashion. A time out was performed confirming the correct extremity, correct patient, and correct procedure.   Previous arthroscopy portals were marked. Local anesthetic was injected to the planned portal sites. The anterolateral portal was established with an 11 blade.      The arthroscope was placed in the anterolateral portal and then into the suprapatellar pouch.  A diagnostic knee scope was completed with the above findings.    Next, the medial portal was established under needle localization.  On examination of the prior medial meniscus repair, it was found to be very torn with sutures having cut through the meniscus root.  The suture material was removed from the joint with an arthroscopic grasper.  The meniscus root was assessed for mobility, but the root could not be mobilized close to the anatomic insertion of the posterior horn meniscus root.  Therefore, decision was made to perform partial medial meniscectomy of the posterior horn.  The posterior horn of the medial meniscus  was debrided using an oscillating shaver until there were stable edges.    Then, attention  was turned toward performing the lysis of adhesions.  The interval between the capsule and deep scar was established bluntly with a shaver starting at the level of the anteromedial portal. Scar tissue between the shaver and condyle was excised. This interval was carried proximally to the level of the VMO. Next, scar tissue from the intercondylar notch was removed such that the ACL and PCL fibers were visible. Care was taken to preserve the intermeniscal ligament. The arthroscope was then moved to the anteromedial portal. The interval between capsule and vastus lateralis was established bluntly with the shaver. Scar tissue between the condyle and shaver was excised. This was also carried proximally to the suprapatellar pouch. Next, the scar tissue in the suprapatellar pouch was debrided until a normal volume was re-established. Care was taken to coagulate all major sources of bleeding throughout the arthroscopy, and a final check was performed with a low-pressure setting on the pump to ensure no major sources of bleeding were left. The joint was then drained of fluid.   Fluoroscopy was used to visualize the proximal tibia. A gentle manipulation under anesthesia was performed. Post-manipulation range of motion was:   Hyperextension: 2.  Extension: 0.  Flexion: 120.   These values were similar to the preoperative measures of the contralateral leg.   The portals were closed with 3-0 Nylon suture.  A corticosteroid injection consisting of 1 cc Kenalog, 2 cc 0.5% bupivacaine, and 2 cc 1% lidocaine was administered to the knee joint.  Sterile dressings included Xeroform, 4x4s, Sof-Rol, and Bias wrap. A Polarcare was placed.  The patient was then awakened and taken to the PACU hemodynamically stable without complication.     POSTOPERATIVE PLAN: The patient will be discharged home today once they meet PACU criteria. Aspirin 325 mg daily was prescribed for 2 weeks for DVT prophylaxis.  Physical therapy will start  on POD#1 and continue daily for the remainder of the week. CPM machine to start today with settings from 0-120 for at least 8 hours/day.  Weight-bearing as tolerated. Follow up as outpatient in 1 week.

## 2021-09-16 NOTE — Anesthesia Procedure Notes (Signed)
Procedure Name: LMA Insertion Date/Time: 09/16/2021 12:45 PM Performed by: Cameron Ali, CRNA Pre-anesthesia Checklist: Patient identified, Emergency Drugs available, Suction available, Timeout performed and Patient being monitored Patient Re-evaluated:Patient Re-evaluated prior to induction Oxygen Delivery Method: Circle system utilized Preoxygenation: Pre-oxygenation with 100% oxygen Induction Type: IV induction LMA: LMA inserted LMA Size: 4.0 Number of attempts: 1 Placement Confirmation: positive ETCO2 and breath sounds checked- equal and bilateral Tube secured with: Tape Dental Injury: Teeth and Oropharynx as per pre-operative assessment

## 2021-09-16 NOTE — H&P (Signed)
Paper H&P to be scanned into permanent record. H&P reviewed. No significant changes noted.  

## 2021-09-16 NOTE — Discharge Instructions (Signed)
Arthroscopic Knee Surgery   Post-Op Instructions   1. Bracing or crutches: Crutches will be provided at the time of discharge from the surgery center if you do not already have them.   2. Ice: You may be provided with a device [facility]) that allows you to ice the affected area effectively. Otherwise you can ice manually.    3. Driving:  Plan on not driving for at least one week. Please note that you are advised NOT to drive while taking narcotic pain medications as you may be impaired and unsafe to drive.   4. Activity: Ankle pumps several times an hour while awake to prevent blood clots. Weight bearing: as tolerated. Use crutches for as needed (usually ~1 week or less) until pain allows you to ambulate without a limp. Bending and straightening the knee is unlimited. Elevate knee above heart level as much as possible for one week. Avoid standing more than 5 minutes (consecutively) for the first week.  Avoid long distance travel for 2 weeks.  Use CPM machine on maximum knee flexion setting for 6-8 hours/day. Can do all at one sitting or break up times throughout the day. If no CPM, perform heel slides with towel throughout the day.   5. Medications:  - You have been provided a prescription for narcotic pain medicine. After surgery, take 1-2 narcotic tablets every 4 hours if needed for severe pain.  - You may take up to 3000mg /day of tylenol (acetaminophen). You can take 1000mg  3x/day. Please check your narcotic. If you have acetaminophen in your narcotic (each tablet will be 325mg ), be careful not to exceed a total of 3000mg /day of acetaminophen.  - A prescription for anti-nausea medication will be provided in case the narcotic medicine causes nausea - take 1 tablet every 6 hours only if nauseated.  - Take ibuprofen 800 mg every 8 hours WITH food to reduce post-operative knee swelling. DO NOT STOP IBUPROFEN POST-OP UNTIL INSTRUCTED TO DO SO at first post-op office visit (10-14 days after  surgery). However, please discontinue if you have any abdominal discomfort after taking this.  - Take enteric coated aspirin 325 mg once daily for 2 weeks to prevent blood clots.   6. Bandages: The physical therapist should change the bandages at the first post-op appointment. If needed, the dressing supplies have been provided to you.   7. Physical Therapy: Start Physical therapy on POD#1 and continue daily for the week, then 2-3x/week until there is pain-free range of motion, then 1-2x/week.    8. Work/School: May return to full work usually around 1-2 weeks.   9. Post-Op Appointments: Your first post-op appointment will be with Dr. Posey Pronto in approximately 1 week.

## 2021-09-16 NOTE — Anesthesia Preprocedure Evaluation (Signed)
Anesthesia Evaluation  Patient identified by MRN, date of birth, ID band Patient awake    Reviewed: Allergy & Precautions, NPO status   History of Anesthesia Complications (+) PONV  Airway Mallampati: II  TM Distance: >3 FB     Dental   Pulmonary    Pulmonary exam normal        Cardiovascular negative cardio ROS   Rhythm:Regular Rate:Normal     Neuro/Psych  Headaches, PSYCHIATRIC DISORDERS Depression    GI/Hepatic GERD  Controlled,  Endo/Other  Obesity - BMI >30  Renal/GU      Musculoskeletal  (+) Arthritis ,   Abdominal   Peds  Hematology   Anesthesia Other Findings   Reproductive/Obstetrics                             Anesthesia Physical  Anesthesia Plan  ASA: 2  Anesthesia Plan: General   Post-op Pain Management:    Induction: Intravenous  PONV Risk Score and Plan: Treatment may vary due to age or medical condition  Airway Management Planned: LMA  Additional Equipment:   Intra-op Plan:   Post-operative Plan:   Informed Consent: I have reviewed the patients History and Physical, chart, labs and discussed the procedure including the risks, benefits and alternatives for the proposed anesthesia with the patient or authorized representative who has indicated his/her understanding and acceptance.     Dental advisory given  Plan Discussed with: CRNA  Anesthesia Plan Comments:         Anesthesia Quick Evaluation

## 2021-09-17 ENCOUNTER — Encounter: Payer: Self-pay | Admitting: Orthopedic Surgery

## 2021-09-28 NOTE — Anesthesia Postprocedure Evaluation (Signed)
Anesthesia Post Note  Patient: Dawn Cain  Procedure(s) Performed: Right knee arthroscopy, lysis of adhesions, partial medial menisectomy and manipulation under anesthesia (Right: Knee)     Patient location during evaluation: PACU Anesthesia Type: General Level of consciousness: awake and alert Pain management: pain level controlled Vital Signs Assessment: post-procedure vital signs reviewed and stable Respiratory status: spontaneous breathing, nonlabored ventilation and respiratory function stable Cardiovascular status: blood pressure returned to baseline and stable Postop Assessment: no apparent nausea or vomiting Anesthetic complications: no   No notable events documented.  April Manson

## 2021-11-24 ENCOUNTER — Other Ambulatory Visit: Payer: Self-pay | Admitting: Orthopedic Surgery

## 2021-11-24 DIAGNOSIS — S83241A Other tear of medial meniscus, current injury, right knee, initial encounter: Secondary | ICD-10-CM

## 2021-11-27 ENCOUNTER — Encounter: Payer: Self-pay | Admitting: Internal Medicine

## 2021-11-27 ENCOUNTER — Other Ambulatory Visit: Payer: Self-pay

## 2021-11-27 ENCOUNTER — Telehealth: Payer: Self-pay

## 2021-11-27 ENCOUNTER — Telehealth (INDEPENDENT_AMBULATORY_CARE_PROVIDER_SITE_OTHER): Payer: Managed Care, Other (non HMO) | Admitting: Internal Medicine

## 2021-11-27 VITALS — Temp 99.5°F | Ht 64.0 in

## 2021-11-27 DIAGNOSIS — U071 COVID-19: Secondary | ICD-10-CM

## 2021-11-27 MED ORDER — PROMETHAZINE-DM 6.25-15 MG/5ML PO SYRP: 5.0000 mL | ORAL_SOLUTION | Freq: Four times a day (QID) | ORAL | 0 refills | Status: DC | PRN

## 2021-11-27 MED ORDER — MOLNUPIRAVIR EUA 200MG CAPSULE: 4.0000 | ORAL_CAPSULE | Freq: Two times a day (BID) | ORAL | 0 refills | Status: AC

## 2021-11-27 NOTE — Progress Notes (Signed)
Date:  11/27/2021   Name:  Dawn Cain   DOB:  Dec 26, 1980   MRN:  [id number]  This encounter was conducted via video encounter due to the need for social distancing in light of the Covid-19 pandemic.  The patient was correctly identified.  I advised that I am conducting the visit from a secure room in my office at [facility] clinic.  The patient is located at home. The limitations of this form of encounter were discussed with the patient and he/she agreed to proceed.  Some vital signs will be absent.  Chief Complaint: Covid Positive (Test pos 1 day ago, home test )  URI  Episode onset: 6 days. Maximum temperature: 99.5. Associated symptoms include chest pain, congestion, coughing, headaches, a plugged ear sensation, sneezing, a sore throat and swollen glands. Pertinent negatives include no diarrhea, nausea, vomiting or wheezing. Associated symptoms comments: Scratchy throat. Post nasal drip. She has tried decongestant for the symptoms. The treatment provided mild relief.  Started to have very sore throat and congestion on Monday.  Tested negative the next day for Covid but continued to feel ill.  Tested again yesterday morning and was positive.  Taking Mucinex cold and flu and Robitussin DM.    Lab Results  Component Value Date   NA 139 03/31/2021   K 4.2 03/31/2021   CO2 19 (L) 03/31/2021   GLUCOSE 90 03/31/2021   BUN 12 03/31/2021   CREATININE 0.67 03/31/2021   CALCIUM 9.0 03/31/2021   EGFR 113 03/31/2021   GFRNONAA 97 09/12/2019   Lab Results  Component Value Date   CHOL 157 03/31/2021   HDL 39 (L) 03/31/2021   LDLCALC 95 03/31/2021   TRIG 125 03/31/2021   CHOLHDL 4.0 03/31/2021   Lab Results  Component Value Date   TSH 3.100 03/31/2021   Lab Results  Component Value Date   HGBA1C 5.4 09/12/2019   Lab Results  Component Value Date   WBC 6.0 03/31/2021   HGB 12.8 03/31/2021   HCT 39.1 03/31/2021   MCV 84 03/31/2021   PLT 211 03/31/2021   Lab Results   Component Value Date   ALT 16 03/31/2021   AST 16 03/31/2021   ALKPHOS 57 03/31/2021   BILITOT 0.3 03/31/2021   Lab Results  Component Value Date   VD25OH 34.1 09/12/2019     Review of Systems  Constitutional:  Positive for chills and fever. Negative for diaphoresis.  HENT:  Positive for congestion, postnasal drip, sneezing and sore throat.   Respiratory:  Positive for cough. Negative for shortness of breath and wheezing.   Cardiovascular:  Positive for chest pain.  Gastrointestinal:  Negative for diarrhea, nausea and vomiting.  Musculoskeletal:  Positive for myalgias.  Neurological:  Positive for headaches.   Patient Active Problem List   Diagnosis Date Noted   Sebaceous cyst of finger of right hand 09/23/2020   Right ovarian cyst 04/11/2020   Polymenorrhea 09/12/2019   Infertility, female 03/23/2018   Suppurative hidradenitis 03/23/2018   Urinary, incontinence, stress female 03/23/2018   Plantar fasciitis 06/05/2015   Headache, menstrual migraine 06/05/2015   Endometriosis 02/27/2014    Allergies  Allergen Reactions   Erythromycin Shortness Of Breath and Swelling    Chest pain   Imitrex [Sumatriptan]     neck muscle tightness    Past Surgical History:  Procedure Laterality Date   ACHILLES TENDON SURGERY Right 04/09/2017   Procedure: ACHILLES TENDON REPAIR;  Surgeon: Samara Deist, DPM;  Location:  [facility];  Service: Podiatry;  Laterality: Right;   GANGLION CYST EXCISION Right 04/09/2021   Procedure: EXCISION OF MUCOID CYST FROM RIGHT LONG FINGER;  Surgeon: Corky Mull, MD;  Location: [facility];  Service: Orthopedics;  Laterality: Right;   HEEL SPUR SURGERY  03/2017   04/09/2017   KNEE ARTHROSCOPY WITH MENISCAL REPAIR Right 07/18/2021   Procedure: Right knee arthroscopic medial meniscus root repair and chondroplasty;  Surgeon: Leim Fabry, MD;  Location: [place];  Service: Orthopedics;  Laterality: Right;   KNEE CLOSED REDUCTION Right 09/16/2021    Procedure: Right knee arthroscopy, lysis of adhesions, partial medial menisectomy and manipulation under anesthesia;  Surgeon: Leim Fabry, MD;  Location: [place];  Service: Orthopedics;  Laterality: Right;   LEFT OOPHORECTOMY Left 2013   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   LIPOMA EXCISION Left 10/31/2015   Procedure: EXCISION LIPOMA;  Surgeon: Clayburn Pert, MD;  Location: [facility];  Service: General;  Laterality: Left;   OSTECTOMY Right 04/09/2017   Procedure: OSTECTOMY-[place]/Retrocalcaneal;  Surgeon: Samara Deist, DPM;  Location: [facility];  Service: Podiatry;  Laterality: Right;   OVARIAN CYST REMOVAL Left 05/02/2009   OVARIAN CYST REMOVAL Left 09/23/2006    Social History   Tobacco Use   Smoking status: Never   Smokeless tobacco: Never  Vaping Use   Vaping Use: Never used  Substance Use Topics   Alcohol use: Yes    Alcohol/week: 2.0 standard drinks    Types: 2 Standard drinks or equivalent per week    Comment: rare   Drug use: No     Medication list has been reviewed and updated.  Current Meds  Medication Sig   acetaminophen (TYLENOL) 500 MG tablet Take 2 tablets (1,000 mg total) by mouth every 8 (eight) hours.   almotriptan (AXERT) 12.5 MG tablet Take 1 tablet (12.5 mg total) by mouth as needed for migraine. may repeat in 2 hours if needed   calcium carbonate (TUMS - DOSED IN MG ELEMENTAL CALCIUM) 500 MG chewable tablet Chew 1 tablet by mouth daily as needed for indigestion or heartburn.   guaiFENesin (MUCINEX PO) Take by mouth.   molnupiravir EUA (LAGEVRIO) 200 mg CAPS capsule Take 4 capsules (800 mg total) by mouth 2 (two) times daily for 5 days.   Multiple Vitamins-Minerals (MULTIVITAMIN WITH MINERALS) tablet Take 1 tablet by mouth daily.   promethazine-dextromethorphan (PROMETHAZINE-DM) 6.25-15 MG/5ML syrup Take 5 mLs by mouth 4 (four) times daily as needed for cough.    PHQ 2/9 Scores 11/27/2021 03/13/2021 03/14/2020 10/23/2019  PHQ - 2 Score 0 2 0 0   PHQ- 9 Score 2 4 - -    GAD 7 : Generalized Anxiety Score 11/27/2021 03/13/2021  Nervous, Anxious, on Edge 0 0  Control/stop worrying 0 0  Worry too much - different things 0 0  Trouble relaxing 0 0  Restless 0 0  Easily annoyed or irritable 0 0  Afraid - awful might happen 0 0  Total GAD 7 Score 0 0    BP Readings from Last 3 Encounters:  09/16/21 122/72  07/18/21 114/67  04/09/21 120/68    Physical Exam Vitals and nursing note reviewed.  Constitutional:      General: She is not in acute distress.    Appearance: She is well-developed. She is ill-appearing.  HENT:     Head: Normocephalic and atraumatic.  Pulmonary:     Effort: Pulmonary effort is normal. No respiratory distress.  Skin:  Findings: No rash.  Neurological:     Mental Status: She is alert and oriented to person, place, and time.  Psychiatric:        Mood and Affect: Mood normal.        Behavior: Behavior normal.    Wt Readings from Last 3 Encounters:  09/16/21 233 lb 14.5 oz (106.1 kg)  07/18/21 234 lb (106.1 kg)  04/09/21 235 lb (106.6 kg)    Temp 99.5 F (37.5 C)    Ht 5' 4" (1.626 m)    BMI 40.15 kg/m   Assessment and Plan: 1. COVID-19 virus infection Take Tylenol 650 - 1000 mg every 6-8 hours for fever, body aches and headache. Drink plenty of fluids with electrolytes. Monitor for fever that does not decrease and/or shortness of breath that worsens or is present at rest.  - molnupiravir EUA (LAGEVRIO) 200 mg CAPS capsule; Take 4 capsules (800 mg total) by mouth 2 (two) times daily for 5 days.  Dispense: 40 capsule; Refill: 0 - promethazine-dextromethorphan (PROMETHAZINE-DM) 6.25-15 MG/5ML syrup; Take 5 mLs by mouth 4 (four) times daily as needed for cough.  Dispense: 180 mL; Refill: 0  I spent 9 minutes on this encounter, 100% of the visit was via video. Partially dictated using Editor, commissioning. Any errors are unintentional.  Halina Maidens, MD [facility]  11/27/2021

## 2021-11-27 NOTE — Telephone Encounter (Signed)

## 2021-12-02 ENCOUNTER — Ambulatory Visit
Admission: RE | Admit: 2021-12-02 | Discharge: 2021-12-02 | Disposition: A | Discharge status: Home / Self Care | Payer: Managed Care, Other (non HMO) | Source: Ambulatory Visit | Attending: Orthopedic Surgery | Admitting: Orthopedic Surgery

## 2021-12-02 ENCOUNTER — Other Ambulatory Visit: Payer: Self-pay

## 2021-12-02 ENCOUNTER — Encounter: Payer: Self-pay | Admitting: Internal Medicine

## 2021-12-02 DIAGNOSIS — S83241A Other tear of medial meniscus, current injury, right knee, initial encounter: Secondary | ICD-10-CM | POA: Diagnosis not present

## 2021-12-03 ENCOUNTER — Other Ambulatory Visit: Payer: Self-pay | Admitting: Internal Medicine

## 2021-12-03 DIAGNOSIS — Z1231 Encounter for screening mammogram for malignant neoplasm of breast: Secondary | ICD-10-CM

## 2021-12-03 NOTE — Telephone Encounter (Signed)
Please review. Last mammogram was 11/02/2020.  KP

## 2021-12-05 ENCOUNTER — Encounter: Payer: Self-pay | Admitting: Internal Medicine

## 2021-12-12 ENCOUNTER — Other Ambulatory Visit: Payer: Self-pay | Admitting: Orthopedic Surgery

## 2021-12-12 DIAGNOSIS — M25569 Pain in unspecified knee: Secondary | ICD-10-CM

## 2021-12-22 ENCOUNTER — Other Ambulatory Visit: Payer: Self-pay

## 2021-12-22 ENCOUNTER — Ambulatory Visit
Admission: RE | Admit: 2021-12-22 | Discharge: 2021-12-22 | Disposition: A | Discharge status: Home / Self Care | Payer: Managed Care, Other (non HMO) | Source: Ambulatory Visit | Attending: Internal Medicine | Admitting: Internal Medicine

## 2021-12-22 DIAGNOSIS — Z1231 Encounter for screening mammogram for malignant neoplasm of breast: Secondary | ICD-10-CM | POA: Insufficient documentation

## 2022-01-21 ENCOUNTER — Other Ambulatory Visit: Payer: Self-pay

## 2022-01-21 ENCOUNTER — Ambulatory Visit: Payer: Managed Care, Other (non HMO) | Admitting: Internal Medicine

## 2022-01-21 ENCOUNTER — Encounter: Payer: Self-pay | Admitting: Internal Medicine

## 2022-01-21 DIAGNOSIS — G43829 Menstrual migraine, not intractable, without status migrainosus: Secondary | ICD-10-CM

## 2022-01-21 NOTE — Progress Notes (Signed)
Date:  01/21/2022   Name:  Dawn Cain   DOB:  1981-09-25   MRN:  [id number]   Chief Complaint: Migraine  Migraine  This is a recurrent problem. Episode onset: 1 week daily. The problem occurs daily (usually twice a month but over the past few weeks daily). The problem has been unchanged. The pain is located in the Occipital region. The pain does not radiate. The pain quality is similar to prior headaches. The quality of the pain is described as aching and pulsating. The pain is at a severity of 8/10. The pain is severe. Associated symptoms include insomnia and nausea. Pertinent negatives include no dizziness, fever, numbness or weakness. The symptoms are aggravated by caffeine withdrawal. She has tried triptans, acetaminophen and Excedrin for the symptoms. The treatment provided moderate (however having neck muscle tension as a side effect) relief.   Lab Results  Component Value Date   NA 139 03/31/2021   K 4.2 03/31/2021   CO2 19 (L) 03/31/2021   GLUCOSE 90 03/31/2021   BUN 12 03/31/2021   CREATININE 0.67 03/31/2021   CALCIUM 9.0 03/31/2021   EGFR 113 03/31/2021   GFRNONAA 97 09/12/2019   Lab Results  Component Value Date   CHOL 157 03/31/2021   HDL 39 (L) 03/31/2021   LDLCALC 95 03/31/2021   TRIG 125 03/31/2021   CHOLHDL 4.0 03/31/2021   Lab Results  Component Value Date   TSH 3.100 03/31/2021   Lab Results  Component Value Date   HGBA1C 5.4 09/12/2019   Lab Results  Component Value Date   WBC 6.0 03/31/2021   HGB 12.8 03/31/2021   HCT 39.1 03/31/2021   MCV 84 03/31/2021   PLT 211 03/31/2021   Lab Results  Component Value Date   ALT 16 03/31/2021   AST 16 03/31/2021   ALKPHOS 57 03/31/2021   BILITOT 0.3 03/31/2021   Lab Results  Component Value Date   VD25OH 34.1 09/12/2019     Review of Systems  Constitutional:  Negative for chills, fatigue and fever.  Respiratory:  Negative for chest tightness and shortness of breath.   Cardiovascular:  Negative  for chest pain.  Gastrointestinal:  Positive for nausea.  Neurological:  Positive for headaches. Negative for dizziness, speech difficulty, weakness and numbness.  Psychiatric/Behavioral:  The patient has insomnia.    Patient Active Problem List   Diagnosis Date Noted   Sebaceous cyst of finger of right hand 09/23/2020   Right ovarian cyst 04/11/2020   Polymenorrhea 09/12/2019   Infertility, female 03/23/2018   Suppurative hidradenitis 03/23/2018   Urinary, incontinence, stress female 03/23/2018   Plantar fasciitis 06/05/2015   Headache, menstrual migraine 06/05/2015   Endometriosis 02/27/2014    Allergies  Allergen Reactions   Erythromycin Shortness Of Breath and Swelling    Chest pain   Imitrex [Sumatriptan]     neck muscle tightness    Past Surgical History:  Procedure Laterality Date   ACHILLES TENDON SURGERY Right 04/09/2017   Procedure: ACHILLES TENDON REPAIR;  Surgeon: Samara Deist, DPM;  Location: [facility];  Service: Podiatry;  Laterality: Right;   GANGLION CYST EXCISION Right 04/09/2021   Procedure: EXCISION OF MUCOID CYST FROM RIGHT LONG FINGER;  Surgeon: Corky Mull, MD;  Location: [facility];  Service: Orthopedics;  Laterality: Right;   HEEL SPUR SURGERY  03/2017   04/09/2017   KNEE ARTHROSCOPY WITH MENISCAL REPAIR Right 07/18/2021   Procedure: Right knee arthroscopic medial meniscus root repair and chondroplasty;  Surgeon: Leim Fabry, MD;  Location: [place];  Service: Orthopedics;  Laterality: Right;   KNEE CLOSED REDUCTION Right 09/16/2021   Procedure: Right knee arthroscopy, lysis of adhesions, partial medial menisectomy and manipulation under anesthesia;  Surgeon: Leim Fabry, MD;  Location: [place];  Service: Orthopedics;  Laterality: Right;   LEFT OOPHORECTOMY Left 2013   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   LIPOMA EXCISION Left 10/31/2015   Procedure: EXCISION LIPOMA;  Surgeon: Clayburn Pert, MD;  Location: [facility];  Service:  General;  Laterality: Left;   OSTECTOMY Right 04/09/2017   Procedure: OSTECTOMY-[place]/Retrocalcaneal;  Surgeon: Samara Deist, DPM;  Location: [facility];  Service: Podiatry;  Laterality: Right;   OVARIAN CYST REMOVAL Left 05/02/2009   OVARIAN CYST REMOVAL Left 09/23/2006    Social History   Tobacco Use   Smoking status: Never   Smokeless tobacco: Never  Vaping Use   Vaping Use: Never used  Substance Use Topics   Alcohol use: Yes    Alcohol/week: 2.0 standard drinks    Types: 2 Standard drinks or equivalent per week    Comment: rare   Drug use: No     Medication list has been reviewed and updated.  Current Meds  Medication Sig   acetaminophen (TYLENOL) 500 MG tablet Take 2 tablets (1,000 mg total) by mouth every 8 (eight) hours.   almotriptan (AXERT) 12.5 MG tablet Take 1 tablet (12.5 mg total) by mouth as needed for migraine. may repeat in 2 hours if needed   calcium carbonate (TUMS - DOSED IN MG ELEMENTAL CALCIUM) 500 MG chewable tablet Chew 1 tablet by mouth daily as needed for indigestion or heartburn.   ibuprofen (ADVIL) 800 MG tablet ibuprofen 800 mg tablet  TAKE 1 TABLET BY MOUTH THREE TIMES DAILY FOR 14 DAYS   Multiple Vitamins-Minerals (MULTIVITAMIN WITH MINERALS) tablet Take 1 tablet by mouth daily.    PHQ 2/9 Scores 01/21/2022 11/27/2021 03/13/2021 03/14/2020  PHQ - 2 Score 0 0 2 0  PHQ- 9 Score _0 -    GAD 7 : Generalized Anxiety Score 01/21/2022 11/27/2021 03/13/2021  Nervous, Anxious, on Edge 0 0 0  Control/stop worrying 0 0 0  Worry too much - different things 0 0 0  Trouble relaxing 0 0 0  Restless 0 0 0  Easily annoyed or irritable 0 0 0  Afraid - awful might happen 0 0 0  Total GAD 7 Score 0 0 0    BP Readings from Last 3 Encounters:  01/21/22 120/80  09/16/21 122/72  07/18/21 114/67    Physical Exam Vitals and nursing note reviewed.  Constitutional:      General: She is not in acute distress.    Appearance: Normal appearance. She is  well-developed.  HENT:     Head: Normocephalic and atraumatic.  Cardiovascular:     Rate and Rhythm: Normal rate and regular rhythm.  Pulmonary:     Effort: Pulmonary effort is normal. No respiratory distress.     Breath sounds: No wheezing or rhonchi.  Musculoskeletal:     Cervical back: Normal range of motion.  Lymphadenopathy:     Cervical: No cervical adenopathy.  Skin:    General: Skin is warm and dry.     Findings: No rash.  Neurological:     Mental Status: She is alert and oriented to person, place, and time.  Psychiatric:        Mood and Affect: Mood normal.  Behavior: Behavior normal.    Wt Readings from Last 3 Encounters:  01/21/22 234 lb (106.1 kg)  09/16/21 233 lb 14.5 oz (106.1 kg)  07/18/21 234 lb (106.1 kg)    BP 120/80    Pulse 72    Ht 5' 4" (1.626 m)    Wt 234 lb (106.1 kg)    SpO2 96%    BMI 40.17 kg/m   Assessment and Plan: 1. Menstrual migraine without status migrainosus, not intractable Usually monthly but menses have been slightly irregular recently Headaches are occurring just about daily. She is interested in alternative treatment to triptans Samples of Ubrelvy 100 mg daily PRN And Nurtec 75 mg daily PRN are given Call if she wants a prescription of either one.   Partially dictated using Editor, commissioning. Any errors are unintentional.  Halina Maidens, MD [facility]  01/21/2022

## 2022-02-06 DIAGNOSIS — M179 Osteoarthritis of knee, unspecified: Secondary | ICD-10-CM | POA: Insufficient documentation

## 2022-02-12 ENCOUNTER — Other Ambulatory Visit: Payer: Self-pay

## 2022-02-12 ENCOUNTER — Encounter: Payer: Self-pay | Admitting: Obstetrics and Gynecology

## 2022-02-12 ENCOUNTER — Ambulatory Visit: Payer: Managed Care, Other (non HMO) | Admitting: Obstetrics and Gynecology

## 2022-02-12 VITALS — BP 152/78 | HR 76 | Wt 235.0 lb

## 2022-02-12 DIAGNOSIS — N939 Abnormal uterine and vaginal bleeding, unspecified: Secondary | ICD-10-CM | POA: Diagnosis not present

## 2022-02-12 DIAGNOSIS — N3946 Mixed incontinence: Secondary | ICD-10-CM | POA: Insufficient documentation

## 2022-02-12 DIAGNOSIS — N926 Irregular menstruation, unspecified: Secondary | ICD-10-CM

## 2022-02-12 LAB — POCT URINE PREGNANCY: Preg Test, Ur: NEGATIVE

## 2022-02-12 NOTE — Progress Notes (Signed)
Obstetrics and Gynecology Visit ?Return Patient Evaluation ? ?Appointment Date: 02/12/2022 ? ?Primary Care Provider: Glean Hess ? ?OB[facility]N Clinic: [facility] ? ?Chief Complaint: AUB ? ?History of Present Illness:  Dawn Cain is a 41 y.o. G1P0010 with above CC. PMhx significant for HTN, BMI 40s, endometriosis. ? ?Patient has q3wk periods that last for about 5 days with mid cycle and pre cycle spotting. Her period started like normal mid January like normal but then she had bleeding and spotting that continued until about 10 days ago and she had crampiness the entire. She denies a hot flashes, night sweats. She did take a UPT during this time and it was negative.  ? ?She endorses recent history of getting up once in the middle of the night to void and she sits to go but only a minimal amount comes out but she does empty her bladder eventually after sitting and waiting. This maybe occurs once during the day. She has a chronic history of SUI, OAB s/s that has occurred for many years; no lower urinary tract s/s ? ?Review of Systems: as noted in the History of Present Illness. ? ?Patient Active Problem List  ? Diagnosis Date Noted  ? Sebaceous cyst of finger of right hand 09/23/2020  ? Right ovarian cyst 04/11/2020  ? Polymenorrhea 09/12/2019  ? Infertility, female 03/23/2018  ? Suppurative hidradenitis 03/23/2018  ? Urinary, incontinence, stress female 03/23/2018  ? Plantar fasciitis 06/05/2015  ? Headache, menstrual migraine 06/05/2015  ? Endometriosis 02/27/2014  ? ?Medications:  ?Dawn Cain had no medications administered during this visit. ?Current Outpatient Medications  ?Medication Sig Dispense Refill  ? acetaminophen (TYLENOL) 500 MG tablet Take 2 tablets (1,000 mg total) by mouth every 8 (eight) hours. 90 tablet 2  ? calcium carbonate (TUMS - DOSED IN MG ELEMENTAL CALCIUM) 500 MG chewable tablet Chew 1 tablet by mouth daily as needed for indigestion or heartburn.    ?  ibuprofen (ADVIL) 800 MG tablet ibuprofen 800 mg tablet ? TAKE 1 TABLET BY MOUTH THREE TIMES DAILY FOR 14 DAYS    ? Multiple Vitamins-Minerals (MULTIVITAMIN WITH MINERALS) tablet Take 1 tablet by mouth daily.    ? almotriptan (AXERT) 12.5 MG tablet Take 1 tablet (12.5 mg total) by mouth as needed for migraine. may repeat in 2 hours if needed (Patient not taking: Reported on 02/12/2022) 12 tablet 5  ? ?No current facility-administered medications for this visit.  ? ? ?Allergies: is allergic to erythromycin and imitrex [sumatriptan]. ? ?Physical Exam:  ?BP (!) 152/78   Pulse 76   Wt 235 lb (106.6 kg)   BMI 40.34 kg/m?  Body mass index is 40.34 kg/m?. ?General appearance: Well nourished, well developed female in no acute distress.  ?Abdomen: diffusely non tender to palpation, non distended, and no masses, hernias ?Neuro/Psych:  Normal mood and affect.   ? ?Pelvic exam:  ?EGBUS: normal ?Vaginal vault: nomral ?Cervix:  normal, nttp ?Bimanual: negative ? ? ?Labs: UPT negative ? ?Assessment: pt stable ? ?Plan: ? ?1. Mixed urinary incontinence ?Follow up urine culture results. Urogyn referral offered; pt to consider ? ?2. Abnormal uterine bleeding (AUB) ?I d/w her that based on her normal exam, her age, h/o ovarian cysts, h/o infertility, h/o endometriosis that her AUB could be due to age/lowered ovarian reserve. I told her I recommend updating her pap smear and to see how her periods are from here on out. If it goes back to how it was, then just  continue expectant management but if AUB continues then I recommend full work up with u/s, labs.  ?- IGP, Aptima HPV, rfx 16/18,45 ?- Urine Culture ?- Urinalysis, Routine w reflex microscopic ? ? ?RTC: PRN ? ?Durene Romans MD ?Attending ?[facility]) ? ? ? ?

## 2022-02-13 LAB — URINALYSIS, ROUTINE W REFLEX MICROSCOPIC
Bilirubin, UA: NEGATIVE
Glucose, UA: NEGATIVE
Ketones, UA: NEGATIVE
Leukocytes,UA: NEGATIVE
Nitrite, UA: NEGATIVE
Protein,UA: NEGATIVE
RBC, UA: NEGATIVE
Specific Gravity, UA: 1.023 (ref 1.005–1.030)
Urobilinogen, Ur: 0.2 mg/dL (ref 0.2–1.0)
pH, UA: 8.5 — ABNORMAL HIGH (ref 5.0–7.5)

## 2022-02-14 LAB — URINE CULTURE

## 2022-02-16 LAB — IGP, APTIMA HPV, RFX 16/18,45
HPV Aptima: NEGATIVE
PAP Smear Comment: 0

## 2022-02-25 ENCOUNTER — Encounter: Payer: Self-pay | Admitting: Internal Medicine

## 2022-03-03 ENCOUNTER — Other Ambulatory Visit: Payer: Self-pay | Admitting: Internal Medicine

## 2022-03-03 ENCOUNTER — Encounter: Payer: Self-pay | Admitting: Internal Medicine

## 2022-03-03 DIAGNOSIS — G43829 Menstrual migraine, not intractable, without status migrainosus: Secondary | ICD-10-CM

## 2022-03-03 MED ORDER — NURTEC 75 MG PO TBDP: 1.0000 | ORAL_TABLET | Freq: Every day | ORAL | 0 refills | Status: DC | PRN

## 2022-03-17 ENCOUNTER — Encounter: Payer: Managed Care, Other (non HMO) | Admitting: Internal Medicine

## 2022-04-20 ENCOUNTER — Telehealth: Payer: Self-pay | Admitting: Internal Medicine

## 2022-04-20 NOTE — Telephone Encounter (Signed)
Noted  ? ?PA was denied. Pt has already got the medication. ? ?If PA is denied thru [facility] pt can still get the medication. ? ?KP ?

## 2022-04-20 NOTE — Telephone Encounter (Signed)
[facility] pharmacies called and they spoke with insurance and the PA was denied for Rimegepant Sulfate (NURTEC) 75 MG TBDP  / an Appeal can be done through [facility] / phone # [phone] / fax# [phone] please advise  ?

## 2022-05-04 ENCOUNTER — Encounter: Payer: Self-pay | Admitting: Internal Medicine

## 2022-05-04 ENCOUNTER — Ambulatory Visit: Payer: Managed Care, Other (non HMO) | Admitting: Internal Medicine

## 2022-05-04 VITALS — BP 112/74 | HR 88 | Ht 64.0 in | Wt 232.0 lb

## 2022-05-04 DIAGNOSIS — R3911 Hesitancy of micturition: Secondary | ICD-10-CM | POA: Diagnosis not present

## 2022-05-04 DIAGNOSIS — G43829 Menstrual migraine, not intractable, without status migrainosus: Secondary | ICD-10-CM | POA: Diagnosis not present

## 2022-05-04 LAB — POCT URINALYSIS DIPSTICK
Bilirubin, UA: NEGATIVE
Glucose, UA: NEGATIVE
Ketones, UA: NEGATIVE
Leukocytes, UA: NEGATIVE
Nitrite, UA: NEGATIVE
Protein, UA: NEGATIVE
Spec Grav, UA: 1.01 (ref 1.010–1.025)
Urobilinogen, UA: 0.2 E.U./dL
pH, UA: 6.5 (ref 5.0–8.0)

## 2022-05-04 MED ORDER — NURTEC 75 MG PO TBDP: 1.0000 | ORAL_TABLET | Freq: Every day | ORAL | 5 refills | Status: DC | PRN

## 2022-05-04 NOTE — Progress Notes (Signed)
Date:  05/04/2022   Name:  Dawn Cain   DOB:  1981/02/01   MRN:  [id number]   Chief Complaint: Urinary Tract Infection and Migraine (Has not received Nurtec since first month free from [facility])  Urinary Tract Infection  This is a new problem. Episode onset: 5 months ago. The problem occurs every urination. The problem has been waxing and waning. The patient is experiencing no pain. There has been no fever. Associated symptoms include hesitancy and urgency. Pertinent negatives include no chills, frequency or hematuria.  Migraine  This is a recurrent problem. The problem occurs intermittently (about 6-8 days per month). The problem has been unchanged. Pertinent negatives include no fever. Treatments tried: Nurtec qod worked well.   Lab Results  Component Value Date   NA 139 03/31/2021   K 4.2 03/31/2021   CO2 19 (L) 03/31/2021   GLUCOSE 90 03/31/2021   BUN 12 03/31/2021   CREATININE 0.67 03/31/2021   CALCIUM 9.0 03/31/2021   EGFR 113 03/31/2021   GFRNONAA 97 09/12/2019   Lab Results  Component Value Date   CHOL 157 03/31/2021   HDL 39 (L) 03/31/2021   LDLCALC 95 03/31/2021   TRIG 125 03/31/2021   CHOLHDL 4.0 03/31/2021   Lab Results  Component Value Date   TSH 3.100 03/31/2021   Lab Results  Component Value Date   HGBA1C 5.4 09/12/2019   Lab Results  Component Value Date   WBC 6.0 03/31/2021   HGB 12.8 03/31/2021   HCT 39.1 03/31/2021   MCV 84 03/31/2021   PLT 211 03/31/2021   Lab Results  Component Value Date   ALT 16 03/31/2021   AST 16 03/31/2021   ALKPHOS 57 03/31/2021   BILITOT 0.3 03/31/2021   Lab Results  Component Value Date   VD25OH 34.1 09/12/2019     Review of Systems  Constitutional:  Negative for chills, fatigue and fever.  Respiratory:  Negative for chest tightness and shortness of breath.   Cardiovascular:  Negative for chest pain and palpitations.  Genitourinary:  Positive for difficulty urinating, hesitancy and urgency. Negative for  dysuria, frequency and hematuria.  Musculoskeletal:  Negative for arthralgias and gait problem.  Neurological:  Positive for headaches.  Psychiatric/Behavioral:  Negative for dysphoric mood and sleep disturbance. The patient is not nervous/anxious.    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   Sebaceous cyst of finger of right hand 09/23/2020   Right ovarian cyst 04/11/2020   Polymenorrhea 09/12/2019   Infertility, female 03/23/2018   Suppurative hidradenitis 03/23/2018   Urinary, incontinence, stress female 03/23/2018   Plantar fasciitis 06/05/2015   Headache, menstrual migraine 06/05/2015   Endometriosis 02/27/2014    Allergies  Allergen Reactions   Erythromycin Shortness Of Breath and Swelling    Chest pain   Imitrex [Sumatriptan]     neck muscle tightness    Past Surgical History:  Procedure Laterality Date   ACHILLES TENDON SURGERY Right 04/09/2017   Procedure: ACHILLES TENDON REPAIR;  Surgeon: Samara Deist, DPM;  Location: [facility];  Service: Podiatry;  Laterality: Right;   GANGLION CYST EXCISION Right 04/09/2021   Procedure: EXCISION OF MUCOID CYST FROM RIGHT LONG FINGER;  Surgeon: Corky Mull, MD;  Location: [facility];  Service: Orthopedics;  Laterality: Right;   HEEL SPUR SURGERY  03/2017   04/09/2017   KNEE ARTHROSCOPY WITH MENISCAL REPAIR Right 07/18/2021   Procedure: Right knee arthroscopic medial  meniscus root repair and chondroplasty;  Surgeon: Leim Fabry, MD;  Location: [facility];  Service: Orthopedics;  Laterality: Right;   KNEE CLOSED REDUCTION Right 09/16/2021   Procedure: Right knee arthroscopy, lysis of adhesions, partial medial menisectomy and manipulation under anesthesia;  Surgeon: Leim Fabry, MD;  Location: [facility];  Service: Orthopedics;  Laterality: Right;   LEFT OOPHORECTOMY Left 2013   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   LIPOMA EXCISION Left 10/31/2015    Procedure: EXCISION LIPOMA;  Surgeon: Clayburn Pert, MD;  Location: [facility];  Service: General;  Laterality: Left;   OSTECTOMY Right 04/09/2017   Procedure: OSTECTOMY-[place]/Retrocalcaneal;  Surgeon: Samara Deist, DPM;  Location: [facility];  Service: Podiatry;  Laterality: Right;   OVARIAN CYST REMOVAL Left 05/02/2009   OVARIAN CYST REMOVAL Left 09/23/2006    Social History   Tobacco Use   Smoking status: Never   Smokeless tobacco: Never  Vaping Use   Vaping Use: Never used  Substance Use Topics   Alcohol use: Yes    Alcohol/week: 2.0 standard drinks    Types: 2 Standard drinks or equivalent per week    Comment: rare   Drug use: No     Medication list has been reviewed and updated.  Current Meds  Medication Sig   acetaminophen (TYLENOL) 500 MG tablet Take 2 tablets (1,000 mg total) by mouth every 8 (eight) hours.   calcium carbonate (TUMS - DOSED IN MG ELEMENTAL CALCIUM) 500 MG chewable tablet Chew 1 tablet by mouth daily as needed for indigestion or heartburn.   ibuprofen (ADVIL) 800 MG tablet ibuprofen 800 mg tablet  TAKE 1 TABLET BY MOUTH THREE TIMES DAILY FOR 14 DAYS   Multiple Vitamins-Minerals (MULTIVITAMIN WITH MINERALS) tablet Take 1 tablet by mouth daily.   [DISCONTINUED] Rimegepant Sulfate (NURTEC) 75 MG TBDP Take 1 tablet by mouth daily as needed.       05/04/2022    1:16 PM 01/21/2022    2:15 PM 11/27/2021   11:11 AM 03/13/2021    8:22 AM  GAD 7 : Generalized Anxiety Score  Nervous, Anxious, on Edge 0 0 0 0  Control/stop worrying 0 0 0 0  Worry too much - different things 0 0 0 0  Trouble relaxing 0 0 0 0  Restless 0 0 0 0  Easily annoyed or irritable 0 0 0 0  Afraid - awful might happen 0 0 0 0  Total GAD 7 Score 0 0 0 0  Anxiety Difficulty Not difficult at all          05/04/2022    1:16 PM  Depression screen PHQ 2/9  Decreased Interest 0  Down, Depressed, Hopeless 0  PHQ - 2 Score 0  Altered sleeping 0  Tired, decreased energy 0  Change in  appetite 0  Feeling bad or failure about yourself  0  Trouble concentrating 0  Moving slowly or fidgety/restless 0  Suicidal thoughts 0  PHQ-9 Score 0  Difficult doing work/chores Not difficult at all    BP Readings from Last 3 Encounters:  05/04/22 112/74  02/12/22 (!) 152/78  01/21/22 120/80    Physical Exam Vitals and nursing note reviewed.  Constitutional:      General: She is not in acute distress.    Appearance: Normal appearance. She is well-developed.  HENT:     Head: Normocephalic and atraumatic.  Cardiovascular:     Rate and Rhythm: Normal rate and regular rhythm.  Pulmonary:  Effort: Pulmonary effort is normal. No respiratory distress.     Breath sounds: No wheezing or rhonchi.  Abdominal:     General: There is no distension.     Palpations: Abdomen is soft. There is no mass.     Tenderness: There is no abdominal tenderness. There is no guarding.  Skin:    General: Skin is warm and dry.     Findings: No rash.  Neurological:     Mental Status: She is alert and oriented to person, place, and time.  Psychiatric:        Mood and Affect: Mood normal.        Behavior: Behavior normal.    Wt Readings from Last 3 Encounters:  05/04/22 232 lb (105.2 kg)  02/12/22 235 lb (106.6 kg)  01/21/22 234 lb (106.1 kg)    BP 112/74   Pulse 88   Ht _0  (1.626 m)   Wt 232 lb (105.2 kg)   SpO2 97%   BMI 39.82 kg/m   Assessment and Plan: 1. Urinary hesitancy UA negative; recent Pelvic exam was normal Will refer to Urology - Ambulatory referral to Urology - POCT Urinalysis Dipstick  2. Menstrual migraine without status migrainosus, not intractable Responded well to qod Nurtec - [place] denied - Rimegepant Sulfate (NURTEC) 75 MG TBDP; Take 1 tablet by mouth daily as needed.  Dispense: 16 tablet; Refill: 5   Partially dictated using Editor, commissioning. Any errors are unintentional.  Halina Maidens, MD [facility]  05/04/2022

## 2022-07-20 ENCOUNTER — Encounter: Payer: Self-pay | Admitting: Urology

## 2022-07-20 ENCOUNTER — Ambulatory Visit: Payer: Managed Care, Other (non HMO) | Admitting: Urology

## 2022-07-20 VITALS — BP 125/76 | HR 93 | Ht 64.0 in | Wt 230.0 lb

## 2022-07-20 DIAGNOSIS — R350 Frequency of micturition: Secondary | ICD-10-CM

## 2022-07-20 DIAGNOSIS — R3915 Urgency of urination: Secondary | ICD-10-CM

## 2022-07-20 DIAGNOSIS — N3946 Mixed incontinence: Secondary | ICD-10-CM

## 2022-07-20 DIAGNOSIS — N393 Stress incontinence (female) (male): Secondary | ICD-10-CM | POA: Diagnosis not present

## 2022-07-20 MED ORDER — MIRABEGRON ER 50 MG PO TB24: 50.0000 mg | ORAL_TABLET | Freq: Every day | ORAL | 11 refills | Status: DC

## 2022-07-20 NOTE — Progress Notes (Addendum)
07/20/2022 3:15 PM   Dawn Cain 1981-10-20 [id number]  Referring provider: Glean Hess, [place],  [place] [address]  No chief complaint on file.   HPI: Was consulted to assess the patient's frequency and urgency.  She can void every hour and cannot hold it for 2 hours.  She will get the urge like her bladder is full and sit and void a small amount.  She has uncommon urge incontinence and leaks a little bit with coughing sneezing.  For a decade she wears 1 light pad.  No bedwetting.  She gets up once or twice a night to urinate  When her bladder is more full her flow is good and she does not feel empty.  No hysterectomy  No history of kidney stones bladder surgery or bladder infections.  No treatment.  Superficial lipoma removed underneath skin lower back years ago   PMH: Past Medical History:  Diagnosis Date   Depression    Endometriosis 02/27/2014   dx'd laparoscopically     GERD (gastroesophageal reflux disease)    Headache    Migraines - 2-3x/month   Heel spur, right    Infertility    trying for 11 years.   Lipoma of back    Patellar tendonitis 12/15/2016   PONV (postoperative nausea and vomiting)     Surgical History: Past Surgical History:  Procedure Laterality Date   ACHILLES TENDON SURGERY Right 04/09/2017   Procedure: ACHILLES TENDON REPAIR;  Surgeon: Samara Deist, DPM;  Location: [facility];  Service: Podiatry;  Laterality: Right;   GANGLION CYST EXCISION Right 04/09/2021   Procedure: EXCISION OF MUCOID CYST FROM RIGHT LONG FINGER;  Surgeon: Corky Mull, MD;  Location: [facility];  Service: Orthopedics;  Laterality: Right;   HEEL SPUR SURGERY  03/2017   04/09/2017   KNEE ARTHROSCOPY WITH MENISCAL REPAIR Right 07/18/2021   Procedure: Right knee arthroscopic medial meniscus root repair and chondroplasty;  Surgeon: Leim Fabry, MD;  Location: [facility];  Service: Orthopedics;  Laterality: Right;   KNEE CLOSED REDUCTION  Right 09/16/2021   Procedure: Right knee arthroscopy, lysis of adhesions, partial medial menisectomy and manipulation under anesthesia;  Surgeon: Leim Fabry, MD;  Location: [facility];  Service: Orthopedics;  Laterality: Right;   LEFT OOPHORECTOMY Left 2013   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   LIPOMA EXCISION Left 10/31/2015   Procedure: EXCISION LIPOMA;  Surgeon: Clayburn Pert, MD;  Location: [facility];  Service: General;  Laterality: Left;   OSTECTOMY Right 04/09/2017   Procedure: OSTECTOMY-[place]/Retrocalcaneal;  Surgeon: Samara Deist, DPM;  Location: [facility];  Service: Podiatry;  Laterality: Right;   OVARIAN CYST REMOVAL Left 05/02/2009   OVARIAN CYST REMOVAL Left 09/23/2006    Home Medications:  Allergies as of 07/20/2022       Reactions   Erythromycin Shortness Of Breath, Swelling   Chest pain   Imitrex [sumatriptan]    neck muscle tightness        Medication List        Accurate as of July 20, 2022  3:15 PM. If you have any questions, ask your nurse or doctor.          calcium carbonate 500 MG chewable tablet Commonly known as: TUMS - dosed in mg elemental calcium Chew 1 tablet by mouth daily as needed for indigestion or heartburn.   ibuprofen 800 MG tablet Commonly known as: ADVIL ibuprofen 800 mg tablet  TAKE 1 TABLET BY  MOUTH THREE TIMES DAILY FOR 14 DAYS   multivitamin with minerals tablet Take 1 tablet by mouth daily.   Nurtec 75 MG Tbdp Generic drug: Rimegepant Sulfate Take 1 tablet by mouth daily as needed.        Allergies:  Allergies  Allergen Reactions   Erythromycin Shortness Of Breath and Swelling    Chest pain   Imitrex [Sumatriptan]     neck muscle tightness    Family History: Family History  Problem Relation Age of Onset   Hypertension Father    Emphysema Maternal Grandmother    Cancer Maternal Grandfather        Lung   Heart disease Paternal Grandmother    Cancer Paternal Grandfather        stomach     Social History:  reports that she has never smoked. She has never used smokeless tobacco. She reports current alcohol use of about 2.0 standard drinks of alcohol per week. She reports that she does not use drugs.  ROS:                                        Physical Exam: There were no vitals taken for this visit.  Constitutional:  Alert and oriented, No acute distress. HEENT: [place] AT, moist mucus membranes.  Trachea midline, no masses. Cardiovascular: No clubbing, cyanosis, or edema. Respiratory: Normal respiratory effort, no increased work of breathing. GI: Abdomen is soft, nontender, nondistended, no abdominal masses GU: Grade 2 hypermobility of bladder neck and negative cough test.  Exam was a bit limited by her heavy menstrual cycle Skin: No rashes, bruises or suspicious lesions. Lymph: No cervical or inguinal adenopathy. Neurologic: Grossly intact, no focal deficits, moving all 4 extremities. Psychiatric: Normal mood and affect.  Laboratory Data: Lab Results  Component Value Date   WBC 6.0 03/31/2021   HGB 12.8 03/31/2021   HCT 39.1 03/31/2021   MCV 84 03/31/2021   PLT 211 03/31/2021    Lab Results  Component Value Date   CREATININE 0.67 03/31/2021    No results found for: "PSA"  No results found for: "TESTOSTERONE"  Lab Results  Component Value Date   HGBA1C 5.4 09/12/2019    Urinalysis    Component Value Date/Time   COLORURINE YELLOW (A) 12/13/2019 1123   APPEARANCEUR Clear 02/12/2022 1300   LABSPEC 1.012 12/13/2019 1123   PHURINE 5.0 12/13/2019 1123   GLUCOSEU Negative 02/12/2022 1300   HGBUR MODERATE (A) 12/13/2019 1123   BILIRUBINUR neg 05/04/2022 1339   BILIRUBINUR Negative 02/12/2022 1300   KETONESUR NEGATIVE 12/13/2019 1123   PROTEINUR Negative 05/04/2022 1339   PROTEINUR Negative 02/12/2022 1300   PROTEINUR NEGATIVE 12/13/2019 1123   UROBILINOGEN 0.2 05/04/2022 1339   NITRITE neg 05/04/2022 1339   NITRITE Negative  02/12/2022 1300   NITRITE NEGATIVE 12/13/2019 1123   LEUKOCYTESUR Negative 05/04/2022 1339   LEUKOCYTESUR Negative 02/12/2022 1300   LEUKOCYTESUR NEGATIVE 12/13/2019 1123    Pertinent Imaging: Urine reviewed.  Culture sent.  Chart reviewed  Assessment & Plan: Patient has urgency and frequency and associated slow flow.  She has a bit of stress incontinence.  Reevaluate on Myrbetriq 50 mg samples and prescription for cystoscopy in 6 weeks.  Call if culture positive  The patient describes pushing down and releasing a moderate amount.  I will double check her for cystocele next time or an anatomic cause as noted on my  physical examination above.  I will check a residual next time as well.  Call if culture positive  1. Urinary, incontinence, stress female    No follow-ups on file.  Reece Packer, MD  [facility] [address], [place], [place] [address] [phone]

## 2022-07-20 NOTE — Patient Instructions (Signed)

## 2022-07-20 NOTE — Addendum Note (Signed)
Addended by: Despina Hidden on: 07/20/2022 03:42 PM   Modules accepted: Orders

## 2022-07-21 LAB — URINALYSIS, COMPLETE
Bilirubin, UA: NEGATIVE
Glucose, UA: NEGATIVE
Ketones, UA: NEGATIVE
Leukocytes,UA: NEGATIVE
Nitrite, UA: NEGATIVE
Specific Gravity, UA: 1.03 (ref 1.005–1.030)
Urobilinogen, Ur: 0.2 mg/dL (ref 0.2–1.0)
pH, UA: 5 (ref 5.0–7.5)

## 2022-07-21 LAB — MICROSCOPIC EXAMINATION
Bacteria, UA: NONE SEEN
RBC, Urine: >30 /hpf — AB (ref 0–2)

## 2022-07-22 LAB — CULTURE, URINE COMPREHENSIVE

## 2022-07-27 ENCOUNTER — Encounter: Payer: Self-pay | Admitting: Urology

## 2022-07-30 ENCOUNTER — Encounter: Payer: Self-pay | Admitting: Urology

## 2022-07-30 NOTE — Telephone Encounter (Signed)
Called patient to discuss symptoms, informed to discontinue medication. Will try Gemtesa samples, left up front for patient to pick up.

## 2022-08-03 MED ORDER — OXYBUTYNIN CHLORIDE ER 10 MG PO TB24: 10.0000 mg | ORAL_TABLET | Freq: Every day | ORAL | 11 refills | Status: DC

## 2022-09-21 ENCOUNTER — Encounter: Payer: Self-pay | Admitting: Urology

## 2022-09-21 ENCOUNTER — Ambulatory Visit: Payer: Managed Care, Other (non HMO) | Admitting: Urology

## 2022-09-21 DIAGNOSIS — N3941 Urge incontinence: Secondary | ICD-10-CM | POA: Diagnosis not present

## 2022-09-21 DIAGNOSIS — N393 Stress incontinence (female) (male): Secondary | ICD-10-CM

## 2022-09-21 DIAGNOSIS — N3946 Mixed incontinence: Secondary | ICD-10-CM

## 2022-09-21 MED ORDER — CIPROFLOXACIN HCL 250 MG PO TABS: 250.0000 mg | ORAL_TABLET | Freq: Two times a day (BID) | ORAL | 0 refills | Status: AC

## 2022-09-21 NOTE — Progress Notes (Signed)
09/21/2022 3:12 PM   Dawn Cain 09-Oct-1981 [id number]  Referring provider: Glean Hess, MD [address] [place],  [place] [address]  Chief Complaint  Patient presents with   Cysto    HPI: Was consulted to assess the patient's frequency and urgency.  She can void every hour and cannot hold it for 2 hours.  She will get the urge like her bladder is full and sit and void a small amount.  She has uncommon urge incontinence and leaks a little bit with coughing sneezing.  For a decade she wears 1 light pad.  No bedwetting.  She gets up once or twice a night to urinate  When her bladder is more full her flow is good and she does not feel empty.   No hysterectomy    Grade 2 hypermobility of bladder neck and negative cough test.  Exam was a bit limited by her heavy menstrual cycle- hard to see vault   Patient has urgency and frequency and associated slow flow.  She has a bit of stress incontinence.  Reevaluate on Myrbetriq 50 mg samples and prescription for cystoscopy in 6 weeks.  Call if culture positive   The patient describes pushing down and releasing a moderate amount.  I will double check her for cystocele next time or an anatomic cause as noted on my physical examination above.  I will check a residual next time as well.  Call if culture positive  Today Frequency stable.  Last culture negative Myrbetriq caused a headache.  She came in and got Gemtesa and it started to help but in the last week once again she gets a strong urge in the middle of the night goes to the restroom and then she cannot void  On pelvic examination she had significant descensus of her cervix and uterus.  It descended to approximately 4 cm from the introitus.  She had a grade 2 cystocele with a short anterior vaginal wall with small rectocele.  She had hypermobility the bladder neck and negative cough test  Cystoscopy: Patient underwent flexible cystoscopy.  She was a bit uncomfortable low in  the suprapubic area during filling.  Bladder mucosa and trigone were normal.  Urine may be a little bit cloudy I sent it for culture.  Upon further questioning she says at times she can feel the cervix and is told her gynecologist.  She otherwise does not have significant prolapse symptoms.  She had microscopic hematuria today but she could have a bladder infection.      PMH: Past Medical History:  Diagnosis Date   Depression    Endometriosis 02/27/2014   dx'd laparoscopically     GERD (gastroesophageal reflux disease)    Headache    Migraines - 2-3x/month   Heel spur, right    Infertility    trying for 11 years.   Lipoma of back    Patellar tendonitis 12/15/2016   PONV (postoperative nausea and vomiting)     Surgical History: Past Surgical History:  Procedure Laterality Date   ACHILLES TENDON SURGERY Right 04/09/2017   Procedure: ACHILLES TENDON REPAIR;  Surgeon: Samara Deist, DPM;  Location: [facility];  Service: Podiatry;  Laterality: Right;   GANGLION CYST EXCISION Right 04/09/2021   Procedure: EXCISION OF MUCOID CYST FROM RIGHT LONG FINGER;  Surgeon: Corky Mull, MD;  Location: [facility];  Service: Orthopedics;  Laterality: Right;   HEEL SPUR SURGERY  03/2017   04/09/2017   KNEE ARTHROSCOPY WITH  MENISCAL REPAIR Right 07/18/2021   Procedure: Right knee arthroscopic medial meniscus root repair and chondroplasty;  Surgeon: Leim Fabry, MD;  Location: [facility];  Service: Orthopedics;  Laterality: Right;   KNEE CLOSED REDUCTION Right 09/16/2021   Procedure: Right knee arthroscopy, lysis of adhesions, partial medial menisectomy and manipulation under anesthesia;  Surgeon: Leim Fabry, MD;  Location: [facility];  Service: Orthopedics;  Laterality: Right;   LEFT OOPHORECTOMY Left 2013   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   LIPOMA EXCISION Left 10/31/2015   Procedure: EXCISION LIPOMA;  Surgeon: Clayburn Pert, MD;  Location: [facility];  Service: General;   Laterality: Left;   OSTECTOMY Right 04/09/2017   Procedure: OSTECTOMY-[place]/Retrocalcaneal;  Surgeon: Samara Deist, DPM;  Location: [facility];  Service: Podiatry;  Laterality: Right;   OVARIAN CYST REMOVAL Left 05/02/2009   OVARIAN CYST REMOVAL Left 09/23/2006    Home Medications:  Allergies as of 09/21/2022       Reactions   Erythromycin Shortness Of Breath, Swelling   Chest pain   Imitrex [sumatriptan]    neck muscle tightness        Medication List        Accurate as of September 21, 2022  3:12 PM. If you have any questions, ask your nurse or doctor.          calcium carbonate 500 MG chewable tablet Commonly known as: TUMS - dosed in mg elemental calcium Chew 1 tablet by mouth daily as needed for indigestion or heartburn.   ibuprofen 800 MG tablet Commonly known as: ADVIL ibuprofen 800 mg tablet  TAKE 1 TABLET BY MOUTH THREE TIMES DAILY FOR 14 DAYS   multivitamin with minerals tablet Take 1 tablet by mouth daily.   Nurtec 75 MG Tbdp Generic drug: Rimegepant Sulfate Take 1 tablet by mouth daily as needed.   oxybutynin 10 MG 24 hr tablet Commonly known as: DITROPAN-XL Take 1 tablet (10 mg total) by mouth daily.        Allergies:  Allergies  Allergen Reactions   Erythromycin Shortness Of Breath and Swelling    Chest pain   Imitrex [Sumatriptan]     neck muscle tightness    Family History: Family History  Problem Relation Age of Onset   Hypertension Father    Emphysema Maternal Grandmother    Cancer Maternal Grandfather        Lung   Heart disease Paternal Grandmother    Cancer Paternal Grandfather        stomach    Social History:  reports that she has never smoked. She has never been exposed to tobacco smoke. She has never used smokeless tobacco. She reports current alcohol use of about 2.0 standard drinks of alcohol per week. She reports that she does not use drugs.  ROS:                                         Physical Exam: There were no vitals taken for this visit.  Constitutional:  Alert and oriented, No acute distress. HEENT: [place] AT, moist mucus membranes.  Trachea midline, no masses.   Laboratory Data: Lab Results  Component Value Date   WBC 6.0 03/31/2021   HGB 12.8 03/31/2021   HCT 39.1 03/31/2021   MCV 84 03/31/2021   PLT 211 03/31/2021    Lab Results  Component Value Date   CREATININE 0.67  03/31/2021    No results found for: "PSA"  No results found for: "TESTOSTERONE"  Lab Results  Component Value Date   HGBA1C 5.4 09/12/2019    Urinalysis    Component Value Date/Time   COLORURINE YELLOW (A) 12/13/2019 1123   APPEARANCEUR Hazy (A) 07/20/2022 1519   LABSPEC 1.012 12/13/2019 1123   PHURINE 5.0 12/13/2019 1123   GLUCOSEU Negative 07/20/2022 1519   HGBUR MODERATE (A) 12/13/2019 1123   BILIRUBINUR Negative 07/20/2022 1519   KETONESUR NEGATIVE 12/13/2019 1123   PROTEINUR 2+ (A) 07/20/2022 1519   PROTEINUR NEGATIVE 12/13/2019 1123   UROBILINOGEN 0.2 05/04/2022 1339   NITRITE Negative 07/20/2022 1519   NITRITE NEGATIVE 12/13/2019 1123   LEUKOCYTESUR Negative 07/20/2022 1519   LEUKOCYTESUR NEGATIVE 12/13/2019 1123    Pertinent Imaging:   Assessment & Plan: Patient has microscopic hematuria.  I will get a CT scan and call her if abnormal.  Picture drawn.  Patient has uterine descensus with a cystocele.  I do not think it is related to her bladder dysfunction.  The role of urodynamics and referral to Dr. Louis Meckel discussed.  I do not think she has interstitial cystitis but we will look for evidence of painful bladder with bladder filling.  She did have pressure today when I was performing cystoscopy and will be interesting to see if the culture is positive.  She was doing well on Gemtesa until a week ago and I thought it was best to call in ciprofloxacin 250 mg 1 tablet twice a day for 7 days waiting for the culture  Her main symptom is overactive bladder and  associated flow symptoms. I do not think the prolapse is related to her voiding dysfunction and it would be a lot of surgery for her current level of prolapse symptoms.   She has been having a lot of uterine bleeding sometimes 5 weeks at a time or twice a month.  In spite of this she wants to continue conservative management we will see her local gynecologist.  We talked about referring to a urogynecologist in [place] because of her menstrual bleeding associate with prolapse.  She chose watchful waiting  We will go ahead and get urodynamics for her voiding dysfunction not responding to medication.  We will look for evidence of interstitial cystitis.  My index of suspicion is low that she has this.  She says sometimes she has pelvic discomfort but was nonspecific and it usually occurs with heavy menstrual bleeding. she agreed with the plan.  Antimuscarinics and physical therapy may be treatment options in the future.  Her primary problem is the urgency and flow symptoms and she rarely has urge incontinence  1. Urinary, incontinence, stress female  - Urinalysis, Complete   No follow-ups on file.  Reece Packer, MD  [facility] [address] [place], [place] [address] [phone]

## 2022-09-22 LAB — URINALYSIS, COMPLETE
Bilirubin, UA: NEGATIVE
Glucose, UA: NEGATIVE
Ketones, UA: NEGATIVE
Leukocytes,UA: NEGATIVE
Nitrite, UA: NEGATIVE
Specific Gravity, UA: 1.02 (ref 1.005–1.030)
Urobilinogen, Ur: 0.2 mg/dL (ref 0.2–1.0)
pH, UA: 8.5 — ABNORMAL HIGH (ref 5.0–7.5)

## 2022-09-22 LAB — MICROSCOPIC EXAMINATION

## 2022-09-24 LAB — CULTURE, URINE COMPREHENSIVE

## 2022-11-02 ENCOUNTER — Ambulatory Visit: Payer: Managed Care, Other (non HMO) | Admitting: Urology

## 2022-11-16 ENCOUNTER — Encounter: Payer: Self-pay | Admitting: Obstetrics and Gynecology

## 2022-11-16 ENCOUNTER — Ambulatory Visit: Payer: Managed Care, Other (non HMO) | Admitting: Urology

## 2022-11-16 VITALS — BP 137/77 | HR 81 | Ht 64.0 in | Wt 239.0 lb

## 2022-11-16 DIAGNOSIS — R3915 Urgency of urination: Secondary | ICD-10-CM

## 2022-11-16 DIAGNOSIS — N3281 Overactive bladder: Secondary | ICD-10-CM

## 2022-11-16 DIAGNOSIS — R350 Frequency of micturition: Secondary | ICD-10-CM

## 2022-11-16 MED ORDER — OXYBUTYNIN CHLORIDE ER 10 MG PO TB24: 10.0000 mg | ORAL_TABLET | Freq: Every day | ORAL | 11 refills | Status: DC

## 2022-11-16 NOTE — Progress Notes (Signed)
11/16/2022 8:18 AM   Dawn Cain [birth year] [id number]  Referring provider: Glean Hess, MD [address] [place],  [place] [address]  Chief Complaint  Patient presents with   Urinary Incontinence   Follow-up    HPI: Was consulted to assess the patient's frequency and urgency.  She can void every hour and cannot hold it for 2 hours.  She will get the urge like her bladder is full and sit and void a small amount.  She has uncommon urge incontinence and leaks a little bit with coughing sneezing.  For a decade she wears 1 light pad.  No bedwetting.  She gets up once or twice a night to urinate  When her bladder is more full her flow is good and she does not feel empty.   No hysterectomy     Grade 2 hypermobility of bladder neck and negative cough test.  Exam was a bit limited by her heavy menstrual cycle- hard to see vault    Patient has urgency and frequency and associated slow flow.  She has a bit of stress incontinence.  Reevaluate on Myrbetriq 50 mg samples and prescription for cystoscopy in 6 weeks.  Call if culture positive   The patient describes pushing down and releasing a moderate amount.  I will double check her for cystocele next time or an anatomic cause as noted on my physical examination above.  I will check a residual next time as well.    Myrbetriq caused a headache.  She came in and got Gemtesa and it started to help but in the last week once again she gets a strong urge in the middle of the night goes to the restroom and then she cannot void   On pelvic examination she had significant descensus of her cervix and uterus.  It descended to approximately 4 cm from the introitus.  She had a grade 2 cystocele with a short anterior vaginal wall with small rectocele.  She had hypermobility the bladder neck and negative cough test   Cystoscopy: Patient underwent flexible cystoscopy.  She was a bit uncomfortable low in the suprapubic area during filling.   Bladder mucosa and trigone were normal.  Urine may be a little bit cloudy I sent it for culture.   Upon further questioning she says at times she can feel the cervix and is told her gynecologist.  She otherwise does not have significant prolapse symptoms.   She had microscopic hematuria today but she could have a bladder infection.    Patient has microscopic hematuria.  I will get a CT scan and call her if abnormal.   Picture drawn.  Patient has uterine descensus with a cystocele.  I do not think it is related to her bladder dysfunction.  The role of urodynamics and referral to Dr. Louis Meckel discussed.  I do not think she has interstitial cystitis but we will look for evidence of painful bladder with bladder filling.  She did have pressure today when I was performing cystoscopy and will be interesting to see if the culture is positive.  She was doing well on Gemtesa until a week ago and I thought it was best to call in ciprofloxacin 250 mg 1 tablet twice a day for 7 days waiting for the culture   Her main symptom is overactive bladder and associated flow symptoms. I do not think the prolapse is related to her voiding dysfunction and it would be a lot of surgery for her  current level of prolapse symptoms.    She has been having a lot of uterine bleeding sometimes 5 weeks at a time or twice a month.  In spite of this she wants to continue conservative management we will see her local gynecologist.  We talked about referring to a urogynecologist in [place] because of her menstrual bleeding associate with prolapse.  She chose watchful waiting   We will go ahead and get urodynamics for her voiding dysfunction not responding to medication.  We will look for evidence of interstitial cystitis.  My index of suspicion is low that she has this.  She says sometimes she has pelvic discomfort but was nonspecific and it usually occurs with heavy menstrual bleeding. she agreed with the plan.  Antimuscarinics and  physical therapy may be treatment options in the future.  Her primary problem is the urgency and flow symptoms and she rarely has urge incontinence  Today Frequency stable.  Last culture negative.  On urodynamics she did not void and was catheterized for 100 L.  Maximum bladder capacity was 216 mL.  She had increased bladder sensation.  Bladder was stable.  There was no pain associated with the filling of the bladder.  No stress incontinence with Valsalva pressure of 68 cm of water.  She could not generate a detrusor contraction and would push to try to urinate.  EMG activity within normal limits.  Bladder neck descent at less than a centimeter the details of the urodynamics are signed dictated    PMH: Past Medical History:  Diagnosis Date   Depression    Endometriosis 02/27/2014   dx'd laparoscopically     GERD (gastroesophageal reflux disease)    Headache    Migraines - 2-3x/month   Heel spur, right    Infertility    trying for 11 years.   Lipoma of back    Patellar tendonitis 12/15/2016   PONV (postoperative nausea and vomiting)     Surgical History: Past Surgical History:  Procedure Laterality Date   ACHILLES TENDON SURGERY Right 04/09/2017   Procedure: ACHILLES TENDON REPAIR;  Surgeon: Samara Deist, DPM;  Location: [facility];  Service: Podiatry;  Laterality: Right;   GANGLION CYST EXCISION Right 04/09/2021   Procedure: EXCISION OF MUCOID CYST FROM RIGHT LONG FINGER;  Surgeon: Corky Mull, MD;  Location: [facility];  Service: Orthopedics;  Laterality: Right;   HEEL SPUR SURGERY  03/2017   04/09/2017   KNEE ARTHROSCOPY WITH MENISCAL REPAIR Right 07/18/2021   Procedure: Right knee arthroscopic medial meniscus root repair and chondroplasty;  Surgeon: Leim Fabry, MD;  Location: [facility];  Service: Orthopedics;  Laterality: Right;   KNEE CLOSED REDUCTION Right 09/16/2021   Procedure: Right knee arthroscopy, lysis of adhesions, partial medial menisectomy and manipulation  under anesthesia;  Surgeon: Leim Fabry, MD;  Location: [facility];  Service: Orthopedics;  Laterality: Right;   LEFT OOPHORECTOMY Left 2013   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   LIPOMA EXCISION Left 10/31/2015   Procedure: EXCISION LIPOMA;  Surgeon: Clayburn Pert, MD;  Location: [facility];  Service: General;  Laterality: Left;   OSTECTOMY Right 04/09/2017   Procedure: OSTECTOMY-[place]/Retrocalcaneal;  Surgeon: Samara Deist, DPM;  Location: [facility];  Service: Podiatry;  Laterality: Right;   OVARIAN CYST REMOVAL Left 05/02/2009   OVARIAN CYST REMOVAL Left 09/23/2006    Home Medications:  Allergies as of 11/16/2022       Reactions   Erythromycin Shortness Of Breath, Swelling   Chest pain  Imitrex [sumatriptan]    neck muscle tightness        Medication List        Accurate as of November 16, 2022  8:18 AM. If you have any questions, ask your nurse or doctor.          calcium carbonate 500 MG chewable tablet Commonly known as: TUMS - dosed in mg elemental calcium Chew 1 tablet by mouth daily as needed for indigestion or heartburn.   ibuprofen 800 MG tablet Commonly known as: ADVIL ibuprofen 800 mg tablet  TAKE 1 TABLET BY MOUTH THREE TIMES DAILY FOR 14 DAYS   multivitamin with minerals tablet Take 1 tablet by mouth daily.   Nurtec 75 MG Tbdp Generic drug: Rimegepant Sulfate Take 1 tablet by mouth daily as needed.        Allergies:  Allergies  Allergen Reactions   Erythromycin Shortness Of Breath and Swelling    Chest pain   Imitrex [Sumatriptan]     neck muscle tightness    Family History: Family History  Problem Relation Age of Onset   Hypertension Father    Emphysema Maternal Grandmother    Cancer Maternal Grandfather        Lung   Heart disease Paternal Grandmother    Cancer Paternal Grandfather        stomach    Social History:  reports that she has never smoked. She has never been exposed to tobacco smoke. She has never used  smokeless tobacco. She reports current alcohol use of about 2.0 standard drinks of alcohol per week. She reports that she does not use drugs.  ROS:                                        Physical Exam: There were no vitals taken for this visit.  Constitutional:  Alert and oriented, No acute distress. HEENT: [place] AT, moist mucus membranes.  Trachea midline, no masses.   Laboratory Data: Lab Results  Component Value Date   WBC 6.0 03/31/2021   HGB 12.8 03/31/2021   HCT 39.1 03/31/2021   MCV 84 03/31/2021   PLT 211 03/31/2021    Lab Results  Component Value Date   CREATININE 0.67 03/31/2021    No results found for: "PSA"  No results found for: "TESTOSTERONE"  Lab Results  Component Value Date   HGBA1C 5.4 09/12/2019    Urinalysis    Component Value Date/Time   COLORURINE YELLOW (A) 12/13/2019 1123   APPEARANCEUR Clear 09/21/2022 1513   LABSPEC 1.012 12/13/2019 1123   PHURINE 5.0 12/13/2019 1123   GLUCOSEU Negative 09/21/2022 1513   HGBUR MODERATE (A) 12/13/2019 1123   BILIRUBINUR Negative 09/21/2022 1513   KETONESUR NEGATIVE 12/13/2019 1123   PROTEINUR Trace (A) 09/21/2022 1513   PROTEINUR NEGATIVE 12/13/2019 1123   UROBILINOGEN 0.2 05/04/2022 1339   NITRITE Negative 09/21/2022 1513   NITRITE NEGATIVE 12/13/2019 1123   LEUKOCYTESUR Negative 09/21/2022 1513   LEUKOCYTESUR NEGATIVE 12/13/2019 1123    Pertinent Imaging:   Assessment & Plan: Patient has overactive bladder and no evidence of interstitial cystitis.  Flow symptoms due to overactive bladder and poorly contractile bladder.  Role of physical therapy and antimuscarinics discussed  Reassess in 6 to 7 weeks on oxybutynin ER 10 mg 3 x 11.  Hopefully physical therapy will greatly benefit her.  I am not convinced that third line therapies would  be in her best interest.  There are no diagnoses linked to this encounter.  No follow-ups on file.  Reece Packer, MD  [facility] [address] [place], [place] [address] [phone]

## 2023-01-03 ENCOUNTER — Encounter: Payer: Self-pay | Admitting: Urology

## 2023-01-04 ENCOUNTER — Ambulatory Visit: Payer: Managed Care, Other (non HMO) | Admitting: Urology

## 2023-01-29 ENCOUNTER — Encounter: Payer: Self-pay | Admitting: Internal Medicine

## 2023-05-24 ENCOUNTER — Encounter: Payer: Self-pay | Admitting: Podiatry

## 2023-05-24 ENCOUNTER — Ambulatory Visit: Payer: 59 | Admitting: Podiatry

## 2023-05-24 ENCOUNTER — Ambulatory Visit (INDEPENDENT_AMBULATORY_CARE_PROVIDER_SITE_OTHER): Payer: 59

## 2023-05-24 DIAGNOSIS — M778 Other enthesopathies, not elsewhere classified: Secondary | ICD-10-CM | POA: Diagnosis not present

## 2023-05-24 DIAGNOSIS — M7661 Achilles tendinitis, right leg: Secondary | ICD-10-CM | POA: Diagnosis not present

## 2023-05-24 DIAGNOSIS — M722 Plantar fascial fibromatosis: Secondary | ICD-10-CM | POA: Diagnosis not present

## 2023-05-24 DIAGNOSIS — M7750 Other enthesopathy of unspecified foot: Secondary | ICD-10-CM

## 2023-05-24 NOTE — Patient Instructions (Signed)

## 2023-05-25 NOTE — Progress Notes (Signed)
  Subjective:  Patient ID: Dawn Cain, female    DOB: 1981/04/14,  MRN: [id number]  Chief Complaint  Patient presents with   Foot Pain    np - bilateral heel pain -x several months - left heel hurts on the bottom, right heel hurts more on the back (Achilles)    42 y.o. female presents with the above complaint. History confirmed with patient.  Has a history of right Achilles resection of heel spur and repair approximately 5 years ago with Dr. Ether Griffins at [facility], this is been tender recently as well.  Also dealing with right knee issues taking Celebrex for this.  Left foot heel pain has become quite intense on the bottom of the heel.  Worse with inactivity  Objective:  Physical Exam: warm, good capillary refill, no trophic changes or ulcerative lesions, normal DP and PT pulses, and normal sensory exam. Left Foot: point tenderness over the heel pad and point tenderness of the mid plantar fascia Right Foot: tenderness at Achilles tendon insertion and gastrocnemius equinus is noted with a positive silverskiold test  No images are attached to the encounter.  Radiographs: Multiple views x-ray of both feet: no fracture, dislocation, swelling or degenerative changes noted, plantar calcaneal spur, posterior calcaneal spur, Haglund deformity noted, and possible avulsion fracture of-deformity on the right Assessment:   1. Achilles tendinitis of right lower extremity   2. Plantar fasciitis of left foot      Plan:  Patient was evaluated and treated and all questions answered.  Discussed the etiology and treatment options for plantar fasciitis and chronic Achilles tendinosis including stretching, formal physical therapy, supportive shoegears such as a running shoe or sneaker, pre fabricated orthoses, injection therapy, and oral medications. We also discussed the role of surgical treatment of this for patients who do not improve after exhausting non-surgical treatment options.   -XR reviewed with  patient -Educated patient on stretching and icing of the affected limb -Injection delivered to the plantar fascia of the left foot. -Referral sent to physical therapy due to the complexity of her issues on contralateral limbs -Despite possible avulsion of Haglund deformity has minimal symptoms here the left foot is the bigger issue -Follow-up with me again after physical therapy completed in 8 weeks  After sterile prep with povidone-iodine solution and alcohol, the right heel was injected with 0.5cc 2% xylocaine plain, 0.5cc 0.5% marcaine plain, 5mg  triamcinolone acetonide, and 2mg  dexamethasone was injected along the medial plantar fascia at the insertion on the plantar calcaneus. The patient tolerated the procedure well without complication.   No follow-ups on file.

## 2023-05-26 ENCOUNTER — Encounter: Payer: Self-pay | Admitting: Podiatry

## 2023-06-14 ENCOUNTER — Encounter: Payer: Self-pay | Admitting: Podiatry

## 2023-06-30 ENCOUNTER — Encounter: Payer: Self-pay | Admitting: Podiatry

## 2023-07-19 ENCOUNTER — Ambulatory Visit: Payer: 59 | Admitting: Podiatry

## 2023-07-26 ENCOUNTER — Other Ambulatory Visit: Payer: Self-pay | Admitting: Obstetrics & Gynecology

## 2023-08-17 ENCOUNTER — Encounter (HOSPITAL_COMMUNITY): Payer: Self-pay

## 2023-08-17 NOTE — Progress Notes (Signed)
Surgical Instructions    Your procedure is scheduled on Wednesday, 08/25/23..  Report to [facility] Main Entrance "A" at 10:10 A.M., then check in with the Admitting office.  Call this number if you have problems the morning of surgery:  [phone]   If you have any questions prior to your surgery date call [phone]: Open Monday-Friday 8am-4pm If you experience any cold or flu symptoms such as cough, fever, chills, shortness of breath, etc. between now and your scheduled surgery, please notify us at the above number     Remember:  Do not eat after midnight the night before your surgery  You may drink clear liquids until 9:10 AM the morning of your surgery.   Clear liquids allowed are: Water, Non-Citrus Juices (without pulp), Carbonated Beverages, Clear Tea, Black Coffee ONLY (NO MILK, CREAM OR POWDERED CREAMER of any kind), and Gatorade    Take these medicines the morning of surgery with A SIP OF WATER:  NONE   As of today, STOP taking any Aspirin (unless otherwise instructed by your surgeon) Aleve, Naproxen, Mobic, Ibuprofen, Motrin, Advil, Goody's, BC's, all herbal medications, fish oil, and all vitamins.           Do not wear jewelry or makeup. Do not wear lotions, powders, perfumes or deodorant. Do not shave 48 hours prior to surgery.   Do not bring valuables to the hospital. Do not wear nail polish, gel polish, artificial nails, or any other type of covering on natural nails (fingers and toes) If you have artificial nails or gel coating that need to be removed by a nail salon, please have this removed prior to surgery. Artificial nails or gel coating may interfere with anesthesia's ability to adequately monitor your vital signs.  [facility] is not responsible for any belongings or valuables.     Contacts, glasses, hearing aids, dentures or partials may not be worn into surgery, please bring cases for these belongings   For patients admitted to the hospital, discharge  time will be determined by your treatment team.   SURGICAL WAITING ROOM VISITATION Patients having surgery or a procedure may have no more than 2 support people in the waiting area - these visitors may rotate.   Children under the age of 25 must have an adult with them who is not the patient. If the patient needs to stay at the hospital during part of their recovery, the visitor guidelines for inpatient rooms apply. Pre-op nurse will coordinate an appropriate time for 1 support person to accompany patient in pre-op.  This support person may not rotate.   Please refer to [email or web address] for the visitor guidelines for Inpatients (after your surgery is over and you are in a regular room).    Special instructions:    Oral Hygiene is also important to reduce your risk of infection.  Remember - BRUSH YOUR TEETH THE MORNING OF SURGERY WITH YOUR REGULAR TOOTHPASTE   [facility]- Preparing For Surgery  Before surgery, you can play an important role. Because skin is not sterile, your skin needs to be as free of germs as possible. You can reduce the number of germs on your skin by washing with CHG (chlorahexidine gluconate) Soap before surgery.  CHG is an antiseptic cleaner which kills germs and bonds with the skin to continue killing germs even after washing.     Please do not use if you have an allergy to CHG or antibacterial soaps. If your skin becomes reddened/irritated stop using the CHG.  Do not shave (including legs and underarms) for at least 48 hours prior to first CHG shower. It is OK to shave your face.  Please follow these instructions carefully.     Shower the NIGHT BEFORE SURGERY and the MORNING OF SURGERY with CHG Soap.   If you chose to wash your hair, wash your hair first as usual with your normal shampoo. After you shampoo, rinse your hair and body thoroughly to remove the shampoo.  Then [employer] and genitals (private parts)  with your normal soap and rinse thoroughly to remove soap.  After that Use CHG Soap as you would any other liquid soap. You can apply CHG directly to the skin and wash gently with a scrungie or a clean washcloth.   Apply the CHG Soap to your body ONLY FROM THE NECK DOWN.  Do not use on open wounds or open sores. Avoid contact with your eyes, ears, mouth and genitals (private parts). Wash Face and genitals (private parts)  with your normal soap.   Wash thoroughly, paying special attention to the area where your surgery will be performed.  Thoroughly rinse your body with warm water from the neck down.  DO NOT shower/wash with your normal soap after using and rinsing off the CHG Soap.  Pat yourself dry with a CLEAN TOWEL.  Wear CLEAN PAJAMAS to bed the night before surgery  Place CLEAN SHEETS on your bed the night before your surgery  DO NOT SLEEP WITH PETS.   Day of Surgery:  Take a shower with CHG soap. Wear Clean/Comfortable clothing the morning of surgery Do not apply any deodorants/lotions.   Remember to brush your teeth WITH YOUR REGULAR TOOTHPASTE.    If you received a COVID test during your pre-op visit, it is requested that you wear a mask when out in public, stay away from anyone that may not be feeling well, and notify your surgeon if you develop symptoms. If you have been in contact with anyone that has tested positive in the last 10 days, please notify your surgeon.    Please read over the following fact sheets that you were given.

## 2023-08-18 ENCOUNTER — Encounter (HOSPITAL_COMMUNITY)
Admission: RE | Admit: 2023-08-18 | Discharge: 2023-08-18 | Disposition: A | Discharge status: Home / Self Care | Payer: 59 | Source: Ambulatory Visit | Attending: Obstetrics & Gynecology | Admitting: Obstetrics & Gynecology

## 2023-08-18 ENCOUNTER — Encounter (HOSPITAL_COMMUNITY): Payer: Self-pay

## 2023-08-18 ENCOUNTER — Other Ambulatory Visit: Payer: Self-pay

## 2023-08-18 DIAGNOSIS — Z01812 Encounter for preprocedural laboratory examination: Secondary | ICD-10-CM | POA: Diagnosis not present

## 2023-08-18 DIAGNOSIS — Z01818 Encounter for other preprocedural examination: Secondary | ICD-10-CM | POA: Diagnosis present

## 2023-08-18 LAB — CBC
HCT: 36.2 % (ref 36.0–46.0)
Hemoglobin: 11.3 g/dL — ABNORMAL LOW (ref 12.0–15.0)
MCH: 26 pg (ref 26.0–34.0)
MCHC: 31.2 g/dL (ref 30.0–36.0)
MCV: 83.4 fL (ref 80.0–100.0)
Platelets: 248 10*3/uL (ref 150–400)
RBC: 4.34 MIL/uL (ref 3.87–5.11)
RDW: 14.2 % (ref 11.5–15.5)
WBC: 6.8 10*3/uL (ref 4.0–10.5)
nRBC: 0 % (ref 0.0–0.2)

## 2023-08-18 LAB — TYPE AND SCREEN
ABO/RH(D): O POS
Antibody Screen: NEGATIVE

## 2023-08-18 LAB — BASIC METABOLIC PANEL
Anion gap: 9 (ref 5–15)
BUN: 11 mg/dL (ref 6–20)
CO2: 25 mmol/L (ref 22–32)
Calcium: 9.6 mg/dL (ref 8.9–10.3)
Chloride: 106 mmol/L (ref 98–111)
Creatinine, Ser: 0.72 mg/dL (ref 0.44–1.00)
GFR, Estimated: >60 mL/min (ref >60)
Glucose, Bld: 102 mg/dL — ABNORMAL HIGH (ref 70–99)
Potassium: 4.2 mmol/L (ref 3.5–5.1)
Sodium: 140 mmol/L (ref 135–145)

## 2023-08-18 NOTE — Progress Notes (Signed)
PCP: Dr. Bari Edward Cardiologist: denies  EKG: n/a CXR: n/a ECHO: denies Stress Test: denies Cardiac Cath: denies  ERAS: Clears until 09:10am  Patient denies shortness of breath, fever, cough, and chest pain at PAT appointment.  Patient verbalized understanding of instructions provided today at the PAT appointment.  Patient asked to review instructions at home and day of surgery.

## 2023-08-25 ENCOUNTER — Other Ambulatory Visit: Payer: Self-pay

## 2023-08-25 ENCOUNTER — Inpatient Hospital Stay (HOSPITAL_COMMUNITY)
Admission: RE | Admit: 2023-08-25 | Discharge: 2023-08-26 | DRG: 743 | Disposition: A | Discharge status: Home / Self Care | Payer: 59 | Attending: Obstetrics & Gynecology | Admitting: Obstetrics & Gynecology

## 2023-08-25 ENCOUNTER — Inpatient Hospital Stay (HOSPITAL_COMMUNITY): Payer: 59

## 2023-08-25 ENCOUNTER — Encounter (HOSPITAL_COMMUNITY)
Admission: RE | Disposition: A | Discharge status: Home / Self Care | Payer: Self-pay | Source: Home / Self Care | Attending: Obstetrics & Gynecology

## 2023-08-25 DIAGNOSIS — K219 Gastro-esophageal reflux disease without esophagitis: Secondary | ICD-10-CM | POA: Diagnosis present

## 2023-08-25 DIAGNOSIS — Z8249 Family history of ischemic heart disease and other diseases of the circulatory system: Secondary | ICD-10-CM

## 2023-08-25 DIAGNOSIS — Z825 Family history of asthma and other chronic lower respiratory diseases: Secondary | ICD-10-CM

## 2023-08-25 DIAGNOSIS — Z90721 Acquired absence of ovaries, unilateral: Secondary | ICD-10-CM

## 2023-08-25 DIAGNOSIS — N920 Excessive and frequent menstruation with regular cycle: Secondary | ICD-10-CM | POA: Diagnosis present

## 2023-08-25 DIAGNOSIS — Z9889 Other specified postprocedural states: Secondary | ICD-10-CM

## 2023-08-25 DIAGNOSIS — D259 Leiomyoma of uterus, unspecified: Principal | ICD-10-CM | POA: Diagnosis present

## 2023-08-25 DIAGNOSIS — F32A Depression, unspecified: Secondary | ICD-10-CM | POA: Diagnosis present

## 2023-08-25 HISTORY — PX: MYOMECTOMY: SHX85

## 2023-08-25 LAB — ABO/RH: ABO/RH(D): O POS

## 2023-08-25 LAB — POCT PREGNANCY, URINE: Preg Test, Ur: NEGATIVE

## 2023-08-25 SURGERY — MYOMECTOMY, ABDOMINAL APPROACH
Anesthesia: General | Site: Abdomen

## 2023-08-25 MED ORDER — FENTANYL CITRATE (PF) 100 MCG/2ML IJ SOLN
100.0000 ug | Freq: Once | INTRAMUSCULAR | Status: AC
  Filled 2023-08-25: qty 2

## 2023-08-25 MED ORDER — BUPIVACAINE-EPINEPHRINE (PF) 0.5% -1:200000 IJ SOLN
INTRAMUSCULAR | Status: DC | PRN
Start: 2023-08-25 — End: 2023-08-25
  Administered 2023-08-25 (×2): 25 mL

## 2023-08-25 MED ORDER — 0.9 % SODIUM CHLORIDE (POUR BTL) OPTIME
TOPICAL | Status: DC | PRN
Start: 2023-08-25 — End: 2023-08-25
  Administered 2023-08-25 (×2): 1000 mL

## 2023-08-25 MED ORDER — KETOROLAC TROMETHAMINE 30 MG/ML IJ SOLN
INTRAMUSCULAR | Status: DC | PRN
Start: 2023-08-25 — End: 2023-08-25
  Administered 2023-08-25: 30 mg via INTRAVENOUS

## 2023-08-25 MED ORDER — MIDAZOLAM HCL 2 MG/2ML IJ SOLN
INTRAMUSCULAR | Status: DC | PRN
  Administered 2023-08-25: 2 mg via INTRAVENOUS

## 2023-08-25 MED ORDER — HYDROMORPHONE HCL 1 MG/ML IJ SOLN
INTRAMUSCULAR | Status: AC
  Filled 2023-08-25: qty 1

## 2023-08-25 MED ORDER — VASOPRESSIN 20 UNIT/ML IV SOLN
INTRAVENOUS | Status: AC
  Filled 2023-08-25: qty 1

## 2023-08-25 MED ORDER — DEXAMETHASONE SODIUM PHOSPHATE 10 MG/ML IJ SOLN
INTRAMUSCULAR | Status: DC | PRN
  Administered 2023-08-25: 10 mg via INTRAVENOUS

## 2023-08-25 MED ORDER — EPHEDRINE 5 MG/ML INJ
INTRAVENOUS | Status: AC
  Filled 2023-08-25: qty 5

## 2023-08-25 MED ORDER — ONDANSETRON HCL 4 MG/2ML IJ SOLN: 4.0000 mg | Freq: Four times a day (QID) | INTRAMUSCULAR | Status: DC | PRN

## 2023-08-25 MED ORDER — SUGAMMADEX SODIUM 200 MG/2ML IV SOLN
INTRAVENOUS | Status: DC | PRN
  Administered 2023-08-25: 200 mg via INTRAVENOUS

## 2023-08-25 MED ORDER — ORAL CARE MOUTH RINSE: 15.0000 mL | Freq: Once | OROMUCOSAL | Status: AC

## 2023-08-25 MED ORDER — POVIDONE-IODINE 10 % EX SWAB
2.0000 | Freq: Once | CUTANEOUS | Status: AC
  Administered 2023-08-25: 2 via TOPICAL

## 2023-08-25 MED ORDER — ONDANSETRON HCL 4 MG PO TABS: 4.0000 mg | ORAL_TABLET | Freq: Four times a day (QID) | ORAL | Status: DC | PRN

## 2023-08-25 MED ORDER — LACTATED RINGERS IV SOLN: INTRAVENOUS | Status: DC

## 2023-08-25 MED ORDER — ONDANSETRON HCL 4 MG/2ML IJ SOLN
INTRAMUSCULAR | Status: DC | PRN
  Administered 2023-08-25: 4 mg via INTRAVENOUS

## 2023-08-25 MED ORDER — MIDAZOLAM HCL 2 MG/2ML IJ SOLN
INTRAMUSCULAR | Status: AC
  Filled 2023-08-25: qty 2

## 2023-08-25 MED ORDER — KETOROLAC TROMETHAMINE 30 MG/ML IJ SOLN
INTRAMUSCULAR | Status: AC
  Filled 2023-08-25: qty 1

## 2023-08-25 MED ORDER — MIDAZOLAM HCL 2 MG/2ML IJ SOLN
INTRAMUSCULAR | Status: AC
  Administered 2023-08-25: 2 mg via INTRAVENOUS
  Filled 2023-08-25: qty 2

## 2023-08-25 MED ORDER — FENTANYL CITRATE (PF) 100 MCG/2ML IJ SOLN
INTRAMUSCULAR | Status: AC
  Administered 2023-08-25: 100 ug via INTRAVENOUS
  Filled 2023-08-25: qty 2

## 2023-08-25 MED ORDER — LIDOCAINE 2% (20 MG/ML) 5 ML SYRINGE
INTRAMUSCULAR | Status: DC | PRN
  Administered 2023-08-25: 40 mg via INTRAVENOUS

## 2023-08-25 MED ORDER — PHENYLEPHRINE 80 MCG/ML (10ML) SYRINGE FOR IV PUSH (FOR BLOOD PRESSURE SUPPORT)
PREFILLED_SYRINGE | INTRAVENOUS | Status: AC
  Filled 2023-08-25: qty 10

## 2023-08-25 MED ORDER — MIDAZOLAM HCL 2 MG/2ML IJ SOLN: 0.5000 mg | Freq: Once | INTRAMUSCULAR | Status: DC | PRN

## 2023-08-25 MED ORDER — PROMETHAZINE HCL 25 MG/ML IJ SOLN: 6.2500 mg | INTRAMUSCULAR | Status: DC | PRN

## 2023-08-25 MED ORDER — ROCURONIUM BROMIDE 10 MG/ML (PF) SYRINGE
PREFILLED_SYRINGE | INTRAVENOUS | Status: AC
  Filled 2023-08-25: qty 10

## 2023-08-25 MED ORDER — OXYCODONE HCL 5 MG PO TABS: 5.0000 mg | ORAL_TABLET | Freq: Once | ORAL | Status: DC | PRN

## 2023-08-25 MED ORDER — SCOPOLAMINE 1 MG/3DAYS TD PT72
MEDICATED_PATCH | TRANSDERMAL | Status: AC
  Administered 2023-08-25: 1.5 mg
  Filled 2023-08-25: qty 1

## 2023-08-25 MED ORDER — ACETAMINOPHEN 325 MG PO TABS: 650.0000 mg | ORAL_TABLET | ORAL | Status: DC | PRN

## 2023-08-25 MED ORDER — KETOROLAC TROMETHAMINE 30 MG/ML IJ SOLN
30.0000 mg | Freq: Four times a day (QID) | INTRAMUSCULAR | Status: AC
  Administered 2023-08-25 – 2023-08-26 (×4): 30 mg via INTRAVENOUS
  Filled 2023-08-25 (×4): qty 1

## 2023-08-25 MED ORDER — DEXAMETHASONE SODIUM PHOSPHATE 10 MG/ML IJ SOLN
INTRAMUSCULAR | Status: AC
  Filled 2023-08-25: qty 1

## 2023-08-25 MED ORDER — MEPERIDINE HCL 25 MG/ML IJ SOLN: 6.2500 mg | INTRAMUSCULAR | Status: DC | PRN

## 2023-08-25 MED ORDER — PHENYLEPHRINE 80 MCG/ML (10ML) SYRINGE FOR IV PUSH (FOR BLOOD PRESSURE SUPPORT)
PREFILLED_SYRINGE | INTRAVENOUS | Status: DC | PRN
  Administered 2023-08-25: 80 ug via INTRAVENOUS

## 2023-08-25 MED ORDER — FENTANYL CITRATE (PF) 250 MCG/5ML IJ SOLN
INTRAMUSCULAR | Status: DC | PRN
  Administered 2023-08-25: 100 ug via INTRAVENOUS
  Administered 2023-08-25: 150 ug via INTRAVENOUS

## 2023-08-25 MED ORDER — PROPOFOL 10 MG/ML IV BOLUS
INTRAVENOUS | Status: DC | PRN
Start: 2023-08-25 — End: 2023-08-25
  Administered 2023-08-25: 200 mg via INTRAVENOUS

## 2023-08-25 MED ORDER — MIDAZOLAM HCL 2 MG/2ML IJ SOLN
2.0000 mg | Freq: Once | INTRAMUSCULAR | Status: AC
  Filled 2023-08-25: qty 2

## 2023-08-25 MED ORDER — ONDANSETRON HCL 4 MG/2ML IJ SOLN
INTRAMUSCULAR | Status: AC
  Filled 2023-08-25: qty 2

## 2023-08-25 MED ORDER — CHLORHEXIDINE GLUCONATE 0.12 % MT SOLN
15.0000 mL | Freq: Once | OROMUCOSAL | Status: AC
  Administered 2023-08-25: 15 mL via OROMUCOSAL
  Filled 2023-08-25: qty 15

## 2023-08-25 MED ORDER — ROCURONIUM BROMIDE 10 MG/ML (PF) SYRINGE
PREFILLED_SYRINGE | INTRAVENOUS | Status: DC | PRN
  Administered 2023-08-25: 60 mg via INTRAVENOUS
  Administered 2023-08-25: 10 mg via INTRAVENOUS

## 2023-08-25 MED ORDER — CEFAZOLIN SODIUM-DEXTROSE 2-4 GM/100ML-% IV SOLN
2.0000 g | INTRAVENOUS | Status: AC
  Administered 2023-08-25: 2 g via INTRAVENOUS
  Filled 2023-08-25: qty 100

## 2023-08-25 MED ORDER — MENTHOL 3 MG MT LOZG
1.0000 | LOZENGE | OROMUCOSAL | Status: DC | PRN
  Administered 2023-08-25: 3 mg via ORAL
  Filled 2023-08-25: qty 9

## 2023-08-25 MED ORDER — HYDROMORPHONE HCL 1 MG/ML IJ SOLN
0.2500 mg | INTRAMUSCULAR | Status: DC | PRN
  Administered 2023-08-25: 0.5 mg via INTRAVENOUS
  Administered 2023-08-25: 0.25 mg via INTRAVENOUS

## 2023-08-25 MED ORDER — BUPIVACAINE HCL (PF) 0.25 % IJ SOLN
INTRAMUSCULAR | Status: AC
  Filled 2023-08-25: qty 30

## 2023-08-25 MED ORDER — OXYCODONE HCL 5 MG/5ML PO SOLN: 5.0000 mg | Freq: Once | ORAL | Status: DC | PRN

## 2023-08-25 MED ORDER — OXYCODONE HCL 5 MG PO TABS
5.0000 mg | ORAL_TABLET | ORAL | Status: DC | PRN
  Administered 2023-08-25 – 2023-08-26 (×2): 10 mg via ORAL
  Filled 2023-08-25 (×2): qty 2

## 2023-08-25 MED ORDER — PROPOFOL 10 MG/ML IV BOLUS
INTRAVENOUS | Status: AC
  Filled 2023-08-25: qty 20

## 2023-08-25 MED ORDER — EPHEDRINE SULFATE-NACL 50-0.9 MG/10ML-% IV SOSY
PREFILLED_SYRINGE | INTRAVENOUS | Status: DC | PRN
Start: 2023-08-25 — End: 2023-08-25
  Administered 2023-08-25: 10 mg via INTRAVENOUS

## 2023-08-25 MED ORDER — LIDOCAINE 2% (20 MG/ML) 5 ML SYRINGE
INTRAMUSCULAR | Status: AC
  Filled 2023-08-25: qty 5

## 2023-08-25 MED ORDER — FENTANYL CITRATE (PF) 250 MCG/5ML IJ SOLN
INTRAMUSCULAR | Status: AC
  Filled 2023-08-25: qty 5

## 2023-08-25 MED ORDER — IBUPROFEN 600 MG PO TABS: 600.0000 mg | ORAL_TABLET | Freq: Four times a day (QID) | ORAL | Status: DC

## 2023-08-25 MED ORDER — SODIUM CHLORIDE (PF) 0.9 % IJ SOLN
INTRAMUSCULAR | Status: AC
  Filled 2023-08-25: qty 50

## 2023-08-25 MED ORDER — VASOPRESSIN 20 UNIT/ML IV SOLN
INTRAVENOUS | Status: DC | PRN
  Administered 2023-08-25: 21 mL

## 2023-08-25 SURGICAL SUPPLY — 45 items
APL SKNCLS STERI-STRIP NONHPOA (GAUZE/BANDAGES/DRESSINGS) ×1
BARRIER ADHS 3X4 INTERCEED (GAUZE/BANDAGES/DRESSINGS) IMPLANT
BENZOIN TINCTURE PRP APPL 2/3 (GAUZE/BANDAGES/DRESSINGS) ×1 IMPLANT
BRR ADH 4X3 ABS CNTRL BYND (GAUZE/BANDAGES/DRESSINGS) ×1
CANISTER SUCT 3000ML PPV (MISCELLANEOUS) ×1 IMPLANT
CLSR STERI-STRIP ANTIMIC 1/2X4 (GAUZE/BANDAGES/DRESSINGS) ×1 IMPLANT
CONT SPEC PATH 64OZ SNAP LID (MISCELLANEOUS) ×1 IMPLANT
DRAPE CESAREAN BIRTH W POUCH (DRAPES) ×1 IMPLANT
DRAPE WARM FLUID 44X44 (DRAPES) IMPLANT
DRSG OPSITE POSTOP 4X10 (GAUZE/BANDAGES/DRESSINGS) ×1 IMPLANT
DURAPREP 26ML APPLICATOR (WOUND CARE) ×1 IMPLANT
ELECT NDL TIP 2.8 STRL (NEEDLE) ×1 IMPLANT
ELECT NEEDLE TIP 2.8 STRL (NEEDLE) ×1
GAUZE 4X4 16PLY ~~LOC~~+RFID DBL (SPONGE) IMPLANT
GLOVE BIO SURGEON STRL SZ7 (GLOVE) IMPLANT
GLOVE BIOGEL PI IND STRL 6.5 (GLOVE) IMPLANT
GLOVE BIOGEL PI IND STRL 7.0 (GLOVE) ×2 IMPLANT
GLOVE BIOGEL PI IND STRL 7.5 (GLOVE) IMPLANT
GLOVE SURG ENC MOIS LTX SZ6.5 (GLOVE) ×1 IMPLANT
GOWN STRL REUS W/ TWL LRG LVL3 (GOWN DISPOSABLE) ×3 IMPLANT
GOWN STRL REUS W/TWL LRG LVL3 (GOWN DISPOSABLE) ×4
KIT TURNOVER KIT B (KITS) ×1 IMPLANT
MANIFOLD NEPTUNE II (INSTRUMENTS) IMPLANT
NDL HYPO 22X1.5 SAFETY MO (MISCELLANEOUS) ×2 IMPLANT
NEEDLE HYPO 22X1.5 SAFETY MO (MISCELLANEOUS) ×2
NS IRRIG 1000ML POUR BTL (IV SOLUTION) ×1 IMPLANT
PACK ABDOMINAL GYN (CUSTOM PROCEDURE TRAY) ×1 IMPLANT
PAD ARMBOARD 7.5X6 YLW CONV (MISCELLANEOUS) ×1 IMPLANT
PAD OB MATERNITY 4.3X12.25 (PERSONAL CARE ITEMS) ×1 IMPLANT
RTRCTR C-SECT PINK 25CM LRG (MISCELLANEOUS) IMPLANT
SPIKE FLUID TRANSFER (MISCELLANEOUS) ×1 IMPLANT
SPONGE T-LAP 18X18 ~~LOC~~+RFID (SPONGE) IMPLANT
SUT MON AB 2-0 CT1 27 (SUTURE) ×1 IMPLANT
SUT MON AB 2-0 SH 27 (SUTURE)
SUT MON AB 2-0 SH27 (SUTURE) ×1 IMPLANT
SUT MON AB 3-0 SH 27 (SUTURE) ×2
SUT MON AB 3-0 SH27 (SUTURE) ×1 IMPLANT
SUT VIC AB 0 CT1 18XCR BRD8 (SUTURE) ×1 IMPLANT
SUT VIC AB 0 CT1 27 (SUTURE) ×2
SUT VIC AB 0 CT1 27XBRD ANBCTR (SUTURE) ×2 IMPLANT
SUT VIC AB 0 CT1 8-18 (SUTURE) ×2
SUT VIC AB 4-0 KS 27 (SUTURE) ×1 IMPLANT
SYR CONTROL 10ML LL (SYRINGE) ×2 IMPLANT
TOWEL GREEN STERILE FF (TOWEL DISPOSABLE) ×2 IMPLANT
TRAY FOLEY W/BAG SLVR 14FR (SET/KITS/TRAYS/PACK) ×1 IMPLANT

## 2023-08-25 NOTE — Anesthesia Procedure Notes (Signed)
Anesthesia Regional Block: TAP block   Pre-Anesthetic Checklist: , timeout performed,  Correct Patient, Correct Site, Correct Laterality,  Correct Procedure, Correct Position, site marked,  Risks and benefits discussed,  Surgical consent,  Pre-op evaluation,  At surgeon's request and post-op pain management  Laterality: Right  Prep: chloraprep       Needles:  Injection technique: Single-shot  Needle Type: Echogenic Needle     Needle Length: 9cm  Needle Gauge: 21     Additional Needles:   Procedures:,,,, ultrasound used (permanent image in chart),,    Narrative:  Start time: 08/25/2023 12:17 PM End time: 08/25/2023 12:22 PM Injection made incrementally with aspirations every 5 mL.  Performed by: Personally  Anesthesiologist: Jairo Ben, MD  Additional Notes: Pt identified in Holding room.  Monitors applied. Working IV access confirmed. Timeout, Sterile prep R subcostal abdomen.  #21ga ECHOgenic Arrow block needle into TAP with US guidance.  25cc 0.5% Bupivacaine 1:200k epi injected incrementally after negative test dose.  Patient asymptomatic, VSS, no heme aspirated, tolerated well.   Sandford Craze, MD

## 2023-08-25 NOTE — H&P (Signed)
Dawn Cain is an 42 y.o. female.here for myomectomy due to fibroids causing menorrhagia, pain and pressure. Desires fertility sparing surgery  42 yo heavy menses, clots and fibroid noted in Oct23 on CT now menses are irreg 3-5 wks, ovulation pain and bleeding in b/w cycles. Past Gyn was in [place]  [id number]. h/o left ovarian cysts, removal in 2007, 2010 and finally LSO in 2013.  H/o infertility 20 yrs. H/o endometriosis. Was seeing REI in [place] until 10 yrs back. Was offered IVF but could not afford. Saw REI at [facility] since being in [place], tried IUI one cycle- 7-8 yrs back. CFI tried HSG but she didnt tolerate.  Sees Urologist for bladder urge/ pressure symptoms, meds not helping  Nl paps, no breast progl, mammogram nl (Pap, Mammo [id number])   Office sono-  Uterus enlarged 10x10x11 cm, 462 cc, several fibroids, largest 8x8 cm posterior transmural with SM component, 4 other smaller, 1 cm- 2.5 cm in size. EMS 6.6 mm. Right ovary nl. Left absent, h/o surgery  Patient's last menstrual period was 08/10/2023.    Past Medical History:  Diagnosis Date   Depression    Endometriosis 02/27/2014   dx'd laparoscopically     GERD (gastroesophageal reflux disease)    Headache    Migraines - 2-3x/month   Heel spur, right    Infertility    trying for 20 years.   Lipoma of back    Patellar tendonitis 12/15/2016   PONV (postoperative nausea and vomiting)     Past Surgical History:  Procedure Laterality Date   ACHILLES TENDON SURGERY Right 04/09/2017   Procedure: ACHILLES TENDON REPAIR;  Surgeon: Gwyneth Revels, DPM;  Location: [facility];  Service: Podiatry;  Laterality: Right;   GANGLION CYST EXCISION Right 04/09/2021   Procedure: EXCISION OF MUCOID CYST FROM RIGHT LONG FINGER;  Surgeon: Christena Flake, MD;  Location: [facility];  Service: Orthopedics;  Laterality: Right;   HEEL SPUR SURGERY  03/2017   04/09/2017   KNEE ARTHROSCOPY WITH MENISCAL REPAIR Right 07/18/2021   Procedure: Right knee arthroscopic  medial meniscus root repair and chondroplasty;  Surgeon: Signa Kell, MD;  Location: [facility];  Service: Orthopedics;  Laterality: Right;   KNEE CLOSED REDUCTION Right 09/16/2021   Procedure: Right knee arthroscopy, lysis of adhesions, partial medial menisectomy and manipulation under anesthesia;  Surgeon: Signa Kell, MD;  Location: [facility];  Service: Orthopedics;  Laterality: Right;   LEFT OOPHORECTOMY Left 2013   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   LIPOMA EXCISION Left 10/31/2015   Procedure: EXCISION LIPOMA;  Surgeon: Ricarda Frame, MD;  Location: [facility];  Service: General;  Laterality: Left;   OSTECTOMY Right 04/09/2017   Procedure: OSTECTOMY-[place]/Retrocalcaneal;  Surgeon: Gwyneth Revels, DPM;  Location: [facility];  Service: Podiatry;  Laterality: Right;   OVARIAN CYST REMOVAL Left 05/02/2009   OVARIAN CYST REMOVAL Left 09/23/2006    Family History  Problem Relation Age of Onset   Hypertension Father    Emphysema Maternal Grandmother    Cancer Maternal Grandfather        Lung   Heart disease Paternal Grandmother    Cancer Paternal Grandfather        stomach    Social History:  reports that she has never smoked. She has never been exposed to tobacco smoke. She has never used smokeless tobacco. She reports current alcohol use of about 2.0 standard drinks of alcohol per week. She reports that she does not use drugs.  Allergies:  Allergies  Allergen Reactions   Erythromycin Shortness Of Breath and Swelling    Chest pain   Imitrex [Sumatriptan]     neck muscle tightness    Medications Prior to Admission  Medication Sig Dispense Refill Last Dose   meloxicam (MOBIC) 15 MG tablet Take 15 mg by mouth daily as needed for pain.   Past Month   Multiple Vitamins-Minerals (MULTIVITAMIN WITH MINERALS) tablet Take 1 tablet by mouth 3 (three) times a week.   Past Month    Review of Systems  Blood pressure (!) 141/62, pulse 73, temperature 98.7 F (37.1  C), resp. rate 18, height 5\' 4"  (1.626 m), weight 104.3 kg, last menstrual period 08/10/2023, SpO2 98%. Physical Exam Physical exam:  A&O x 3, no acute distress. Pleasant HEENT neg, no thyromegaly Lungs CTA bilat CV RRR, S1S2 normal Abdo soft, non tender, non acute Extr no edema/ tenderness Pelvic 14 wk uterus w/ multiple fibroids, limited mobility   CBC    Component Value Date/Time   WBC 6.8 08/18/2023 1530   RBC 4.34 08/18/2023 1530   HGB 11.3 (L) 08/18/2023 1530   HGB 12.8 03/31/2021 0721   HCT 36.2 08/18/2023 1530   HCT 39.1 03/31/2021 0721   PLT 248 08/18/2023 1530   PLT 211 03/31/2021 0721   MCV 83.4 08/18/2023 1530   MCV 84 03/31/2021 0721   MCH 26.0 08/18/2023 1530   MCHC 31.2 08/18/2023 1530   RDW 14.2 08/18/2023 1530   RDW 14.3 03/31/2021 0721   LYMPHSABS 1.6 03/31/2021 0721   EOSABS 0.2 03/31/2021 0721   BASOSABS 0.0 03/31/2021 0721      Latest Ref Rng & Units 08/18/2023    3:30 PM    BMP  Glucose 70 - 99 mg/dL 213     BUN 6 - 20 mg/dL 11     Creatinine 0.86 - 1.00 mg/dL 5.78     BUN/Creat Ratio 9 - 23     Sodium 135 - 145 mmol/L 140     Potassium 3.5 - 5.1 mmol/L 4.2     Chloride 98 - 111 mmol/L 106     CO2 22 - 32 mmol/L 25     Calcium 8.9 - 10.3 mg/dL 9.6         Assessment/Plan: 42 yo female with multiple uterine fibroids, menorrhagia and bulk symptoms, here for abdominal myomectomy. H/o endometriosis and h/o Left oophorectomy  Risks/complications of surgery reviewed incl infection, bleeding, damage to internal organs including bladder, bowels, ureters, blood vessels, other risks from anesthesia, VTE and delayed complications of any surgery, complications in future surgery reviewed.  Robley Fries 08/25/2023, 11:12 AM

## 2023-08-25 NOTE — Progress Notes (Signed)
Patient transferred to unit from PACU. Patient alert and oriented x4. Left iv present on arrival. Family in room. Oriented patient to unit and room. Awaiting orders.

## 2023-08-25 NOTE — Anesthesia Preprocedure Evaluation (Signed)
Anesthesia Evaluation  Patient identified by MRN, date of birth, ID band Patient awake    Reviewed: Allergy & Precautions, NPO status , Patient's Chart, lab work & pertinent test results  History of Anesthesia Complications (+) PONV  Airway Mallampati: II  TM Distance: >3 FB Neck ROM: Full    Dental  (+) Dental Advisory Given   Pulmonary neg pulmonary ROS   breath sounds clear to auscultation       Cardiovascular negative cardio ROS  Rhythm:Regular Rate:Normal     Neuro/Psych  Headaches   Depression       GI/Hepatic negative GI ROS, Neg liver ROS,,,  Endo/Other  BMI 39.5  Renal/GU negative Renal ROS     Musculoskeletal   Abdominal   Peds  Hematology negative hematology ROS (+)   Anesthesia Other Findings   Reproductive/Obstetrics                              Anesthesia Physical Anesthesia Plan  ASA: 2  Anesthesia Plan: General   Post-op Pain Management: Ofirmev IV (intra-op)* and Regional block*   Induction: Intravenous  PONV Risk Score and Plan: 4 or greater and Ondansetron, Dexamethasone and Scopolamine patch - Pre-op  Airway Management Planned: Oral ETT  Additional Equipment: None  Intra-op Plan:   Post-operative Plan: Extubation in OR  Informed Consent: I have reviewed the patients History and Physical, chart, labs and discussed the procedure including the risks, benefits and alternatives for the proposed anesthesia with the patient or authorized representative who has indicated his/her understanding and acceptance.     Dental advisory given  Plan Discussed with: CRNA and Surgeon  Anesthesia Plan Comments: (Plan routine monitors, GETA with bilat TAP blocks for post op analgesia)         Anesthesia Quick Evaluation

## 2023-08-25 NOTE — Op Note (Signed)
08/25/2023 Dawn Cain (02-13-1981)   PRE-OPERATIVE DIAGNOSIS:  Uterine Fibroids    POST-OPERATIVE DIAGNOSIS:  Uterine Fibroids   PROCEDURE:  ABDOMINAL Myomectomy   SURGEON: Robley Fries, MD  ASSISTANT:  Marlene Bast MD   ANESTHESIA:  General endotracheal and TAP block by Anesthesiologist   EBL: 150 cc  IVF: 1800 LR   Urine output: 350 cc clear urine in foley  BLOOD ADMINISTERED:  none  SPECIMEN: Fibroids   COUNTS:  YES  PATIENT DISPOSITION:  PACU - hemodynamically stable.    Delay start of Pharmacological VTE agent (>24hrs) due to surgical blood loss or risk of bleeding: yes  PROCEDURE:   Indication: Symptomatic uterine fibroids  Abdominal myomectomy discussed. Risks and complications of surgery including infection, bleeding, damage to internal organs and other including but not limited to surgery related problems including pneumonia, VTE reviewed. Informed written consent was obtained.   Patient was brought to the operating room with IV running. She received 2 gm Ancef. Underwent general anesthesia without difficulty and was given dorsal supine position, prepped and draped in sterile fashion. Foley catheter was placed. Exam under anesthesia noted uterus at 15 wks with posterior myoma.  Pfannenstiel incision was made with scalpel and carried down to the underlying fascia with Bovie with excellent hemostasis.  Fascia incised and extended laterally. Fascia grasped with Kocher's and underlying rectus muscles dissected down. Rectus muscles were separated in midline. Posterior rectus sheath and posterior peritoneum grasped with mosquitoes and peritoneal entry made. Fibroid uterus was palpated. No adhesions noted. Alexis retractor placed. Bowels packed. Left tube and ovary were absent s/p past surgery. Right ovary and tube normal, filmy adhesions of ovary to mesosalpinx noted that were dissected. It was difficult to elevated uterus due to large posterior myoma from fundus to low  posterior wall,  After several attempts, Alexis retractor was removed and uterus with posterior fibroid was brought to the incision and 20:50 cc vasopressin:saline injected. Uterine serosa incised with needle tip cautery until fibroid reached, Fibroid grasped with towel clamp and uterus and fibroid was now being able to brought out of the incision. With blunt and sharp dissection, fibroid was enucleated and removed. Cavity was not entered. Fibroid was about 8-9 cm in longest diameter. Three other fibroids were removed that were about 2 cm from this myoma bed. Now myoma bed was closed in 2 deep layer with 0-Vicryl in interlocking sutures followed by serosa with 3-0 Monocryl. Hemostasis was excellent. Anterior low fibroid was removed with a separate left anterior lateral incision and 2 fibroids removed from this area. Closure done in similar fashion with good hemostasis. Irrigation done. Lap packs removed, Interceed placed to cover the incision. Peritoneum was not needing closure due to being well approximated. Fascia closed with 0-Vicryl. Subcutaneous layer closed with 2-0 Plain gut. Skin approximated with 4-0 Vicryl in subcuticular fashion. Steristrips and Honeycomb dressing placed.  All  Instruments/ lap/ sponges counts were correct x2. No complications.  Dr Juliene Pina was the primary surgeon for entire case.  Shea Evans MD

## 2023-08-25 NOTE — Transfer of Care (Signed)
Immediate Anesthesia Transfer of Care Note  Patient: MALARIE CRESAP  Procedure(s) Performed: ABDOMINAL MYOMECTOMY (Abdomen)  Patient Location: PACU  Anesthesia Type:General  Level of Consciousness: drowsy  Airway & Oxygen Therapy: Patient Spontanous Breathing and Patient connected to face mask oxygen  Post-op Assessment: Report given to RN and Post -op Vital signs reviewed and stable  Post vital signs: Reviewed and stable  Last Vitals:  Vitals Value Taken Time  BP 132/67 08/25/23 1530  Temp 36.2 C 08/25/23 1526  Pulse 102 08/25/23 1535  Resp 19 08/25/23 1535  SpO2 98 % 08/25/23 1535  Vitals shown include unfiled device data.  Last Pain:  Vitals:   08/25/23 1051  PainSc: 5       Patients Stated Pain Goal: 0 (08/25/23 1051)  Complications: No notable events documented.

## 2023-08-25 NOTE — Transfer of Care (Deleted)
Immediate Anesthesia Transfer of Care Note  Patient: Dawn Cain  Procedure(s) Performed: ABDOMINAL MYOMECTOMY (Abdomen)  Patient Location: PACU   Anesthesia Type:General  Level of Consciousness: sedated  Airway & Oxygen Therapy: Patient Spontanous Breathing and Patient connected to face mask oxygen  Post-op Assessment: Report given to RN and Post -op Vital signs reviewed and stable  Post vital signs: stable  Last Vitals:  Vitals Value Taken Time  BP 129/69 08/25/23 1526  Temp    Pulse 113 08/25/23 1528  Resp 23 08/25/23 1528  SpO2 98 % 08/25/23 1528  Vitals shown include unfiled device data.  Last Pain:  Vitals:   08/25/23 1051  PainSc: 5       Patients Stated Pain Goal: 0 (08/25/23 1051)  Complications: No notable events documented.

## 2023-08-25 NOTE — Anesthesia Postprocedure Evaluation (Signed)
Anesthesia Post Note  Patient: Dawn Cain  Procedure(s) Performed: ABDOMINAL MYOMECTOMY (Abdomen)     Patient location during evaluation: PACU Anesthesia Type: General Level of consciousness: awake and alert Pain management: pain level controlled Vital Signs Assessment: post-procedure vital signs reviewed and stable Respiratory status: spontaneous breathing, nonlabored ventilation, respiratory function stable and patient connected to nasal cannula oxygen Cardiovascular status: blood pressure returned to baseline and stable Postop Assessment: no apparent nausea or vomiting Anesthetic complications: no  No notable events documented.  Last Vitals:  Vitals:   08/25/23 1530 08/25/23 1545  BP: 132/67 108/62  Pulse: (!) 121 (!) 103  Resp: (!) 22 16  Temp:    SpO2: 96% 99%    Last Pain:  Vitals:   08/25/23 1545  PainSc: Asleep                 Wynne Jury S

## 2023-08-25 NOTE — Anesthesia Procedure Notes (Signed)
Anesthesia Regional Block: TAP block   Pre-Anesthetic Checklist: , timeout performed,  Correct Patient, Correct Site, Correct Laterality,  Correct Procedure, Correct Position, site marked,  Risks and benefits discussed,  Surgical consent,  Pre-op evaluation,  At surgeon's request and post-op pain management  Laterality: Left  Prep: chloraprep       Needles:  Injection technique: Single-shot  Needle Type: Echogenic Needle     Needle Length: 9cm  Needle Gauge: 21     Additional Needles:   Procedures:,,,, ultrasound used (permanent image in chart),,    Narrative:  Start time: 08/25/2023 12:10 PM End time: 08/25/2023 12:16 PM Injection made incrementally with aspirations every 5 mL.  Performed by: Personally  Anesthesiologist: Jairo Ben, MD  Additional Notes: Pt identified in Holding room.  Monitors applied. Working IV access confirmed. Timeout, Sterile prep L subcostal abdomen.  #21ga ECHOgenic Arrow block needle into TAP with US guidance.  25cc 0.5% Bupivacaine 1:200k epi injected incrementally after negative test dose, good planar spread of local.  Patient asymptomatic, VSS, no heme aspirated, tolerated well.   Sandford Craze, MD

## 2023-08-25 NOTE — Anesthesia Procedure Notes (Addendum)
Procedure Name: Intubation Date/Time: 08/25/2023 12:55 PM  Performed by: April Holding, CRNAPre-anesthesia Checklist: Patient identified, Emergency Drugs available, Suction available and Patient being monitored Patient Re-evaluated:Patient Re-evaluated prior to induction Oxygen Delivery Method: Circle system utilized Preoxygenation: Pre-oxygenation with 100% oxygen Induction Type: IV induction Ventilation: Mask ventilation without difficulty Laryngoscope Size: Miller and 2 Grade View: Grade I Tube type: Oral Tube size: 7.0 mm Number of attempts: 1 Airway Equipment and Method: Stylet and Oral airway Placement Confirmation: ETT inserted through vocal cords under direct vision, positive ETCO2 and breath sounds checked- equal and bilateral Tube secured with: Tape Dental Injury: Teeth and Oropharynx as per pre-operative assessment

## 2023-08-26 ENCOUNTER — Encounter (HOSPITAL_COMMUNITY): Payer: Self-pay | Admitting: Obstetrics & Gynecology

## 2023-08-26 LAB — CBC
HCT: 34.5 % — ABNORMAL LOW (ref 36.0–46.0)
Hemoglobin: 10.8 g/dL — ABNORMAL LOW (ref 12.0–15.0)
MCH: 26.1 pg (ref 26.0–34.0)
MCHC: 31.3 g/dL (ref 30.0–36.0)
MCV: 83.3 fL (ref 80.0–100.0)
Platelets: 221 10*3/uL (ref 150–400)
RBC: 4.14 MIL/uL (ref 3.87–5.11)
RDW: 14.2 % (ref 11.5–15.5)
WBC: 11.4 10*3/uL — ABNORMAL HIGH (ref 4.0–10.5)
nRBC: 0 % (ref 0.0–0.2)

## 2023-08-26 MED ORDER — ACETAMINOPHEN 325 MG PO TABS: 650.0000 mg | ORAL_TABLET | ORAL | Status: AC | PRN

## 2023-08-26 MED ORDER — OXYCODONE HCL 5 MG PO TABS: 5.0000 mg | ORAL_TABLET | ORAL | 0 refills | Status: DC | PRN

## 2023-08-26 MED ORDER — IBUPROFEN 600 MG PO TABS: 600.0000 mg | ORAL_TABLET | Freq: Four times a day (QID) | ORAL | 0 refills | Status: DC

## 2023-08-26 NOTE — Progress Notes (Signed)
1 Day Post-Op Procedure(s) (LRB): ABDOMINAL MYOMECTOMY (N/A)  Subjective: Patient reports some pain today, was great yesterday. Took oxycodone yesterday but none today. Now getting last toradol inj.  No n/v today, ate regular diet, voiding wel, ambulating .    Objective: I have reviewed patient's vital signs, intake and output, medications, and labs.  General: alert, cooperative, and appears stated age Resp: clear to auscultation bilaterally Cardio: regular rate and rhythm, S1, S2 normal, no murmur, click, rub or gallop GI: soft, non-tender; bowel sounds normal; no masses,  no organomegaly Extremities: extremities normal, atraumatic, no cyanosis or edema and Homans sign is negative, no sign of DVT Vaginal Bleeding: none  Assessment: s/p Procedure(s): ABDOMINAL MYOMECTOMY (N/A): stable, progressing well, and tolerating diet Surgical findings and OR photos reviewed  Plan: Discharge home Post op care and warning ss dw pt   LOS: 1 day  F/up 2 wks with me    Robley Fries, MD 08/26/2023, 1:28 PM

## 2023-08-26 NOTE — Discharge Summary (Signed)
Physician Discharge Summary  Patient ID: Dawn Cain MRN: [id number] DOB/AGE: April 05, 1981 42 y.o.  Admit date: 08/25/2023 Discharge date: 08/26/2023  Admission Diagnoses: Uterine fibroids   Discharge Diagnoses:  Principal Problem:   Leiomyoma of uterus, unspecified Active Problems:   S/P myomectomy   Discharged Condition: good  Hospital Course: Uncomplicated surgery, Post op recovery as expected. POD 1 with general diet, no nausea/ vomiting/ Ambulating, voiding. Pain worse on POD 1 with TAP block wearing off, but PO meds helping and discharge was acceptable to patient.     Latest Ref Rng & Units 08/26/2023    3:27 AM 08/18/2023    3:30 PM 03/31/2021    7:21 AM  CBC  WBC 4.0 - 10.5 K/uL 11.4  6.8  6.0   Hemoglobin 12.0 - 15.0 g/dL 78.2  95.6  21.3   Hematocrit 36.0 - 46.0 % 34.5  36.2  39.1   Platelets 150 - 400 K/uL 221  248  211      Discharge Exam: Blood pressure 117/66, pulse 95, temperature 98.3 F (36.8 C), temperature source Oral, resp. rate 16, height 5\' 4"  (1.626 m), weight 104.3 kg, last menstrual period 08/10/2023, SpO2 98%. General appearance: alert, cooperative, and appears stated age Resp: clear to auscultation bilaterally Cardio: regular rate and rhythm, S1, S2 normal, no murmur, click, rub or gallop GI: normal findings: bowel sounds normal and soft, non-tender Extremities: Homans sign is negative, no sign of DVT  Disposition: Discharge disposition: 01-Home or Self Care     Post op care and warning signs dw pt  Discharge Instructions     Call MD for:  difficulty breathing, headache or visual disturbances   Complete by: As directed    Call MD for:  extreme fatigue   Complete by: As directed    Call MD for:  hives   Complete by: As directed    Call MD for:  persistant dizziness or light-headedness   Complete by: As directed    Call MD for:  persistant nausea and vomiting   Complete by: As directed    Call MD for:  redness, tenderness, or signs of  infection (pain, swelling, redness, odor or green/yellow discharge around incision site)   Complete by: As directed    Call MD for:  severe uncontrolled pain   Complete by: As directed    Call MD for:  temperature >100.4   Complete by: As directed    Diet - low sodium heart healthy   Complete by: As directed    Driving Restrictions   Complete by: As directed    2 weeks but more as needed   Increase activity slowly   Complete by: As directed    Lifting restrictions   Complete by: As directed    10 pound restriction for 6 weeks   Sexual Activity Restrictions   Complete by: As directed    4 weeks      Allergies as of 08/26/2023       Reactions   Erythromycin Shortness Of Breath, Swelling   Chest pain   Imitrex [sumatriptan]    neck muscle tightness        Medication List     TAKE these medications    acetaminophen 325 MG tablet Commonly known as: TYLENOL Take 2 tablets (650 mg total) by mouth every 4 (four) hours as needed for mild pain (temperature > 101.5.).   ibuprofen 600 MG tablet Commonly known as: ADVIL Take 1 tablet (600 mg total) by mouth  every 6 (six) hours.   meloxicam 15 MG tablet Commonly known as: MOBIC Take 15 mg by mouth daily as needed for pain.   multivitamin with minerals tablet Take 1 tablet by mouth 3 (three) times a Cain.   oxyCODONE 5 MG immediate release tablet Commonly known as: Oxy IR/ROXICODONE Take 1-2 tablets (5-10 mg total) by mouth every 4 (four) hours as needed for moderate pain.        Follow-up Information     Shea Evans, MD Follow up in 2 Cain(s).   Specialty: Obstetrics and Gynecology Contact information: [address] [place] [address] [phone]                 Signed: Robley Fries 08/26/2023, 1:31 PM

## 2023-08-27 LAB — SURGICAL PATHOLOGY

## 2024-04-24 ENCOUNTER — Ambulatory Visit: Admitting: Family Medicine

## 2024-04-24 ENCOUNTER — Encounter: Payer: Self-pay | Admitting: Family Medicine

## 2024-04-24 ENCOUNTER — Encounter: Payer: Self-pay | Admitting: Gastroenterology

## 2024-04-24 ENCOUNTER — Telehealth: Payer: Self-pay | Admitting: Family Medicine

## 2024-04-24 VITALS — BP 138/81 | HR 68 | Temp 99.1°F | Ht 64.0 in | Wt 241.0 lb

## 2024-04-24 DIAGNOSIS — G43009 Migraine without aura, not intractable, without status migrainosus: Secondary | ICD-10-CM | POA: Diagnosis not present

## 2024-04-24 DIAGNOSIS — F411 Generalized anxiety disorder: Secondary | ICD-10-CM

## 2024-04-24 DIAGNOSIS — K644 Residual hemorrhoidal skin tags: Secondary | ICD-10-CM

## 2024-04-24 DIAGNOSIS — Z7689 Persons encountering health services in other specified circumstances: Secondary | ICD-10-CM

## 2024-04-24 DIAGNOSIS — F331 Major depressive disorder, recurrent, moderate: Secondary | ICD-10-CM | POA: Diagnosis not present

## 2024-04-24 DIAGNOSIS — R198 Other specified symptoms and signs involving the digestive system and abdomen: Secondary | ICD-10-CM

## 2024-04-24 MED ORDER — ESCITALOPRAM OXALATE 10 MG PO TABS
10.0000 mg | ORAL_TABLET | Freq: Every day | ORAL | 1 refills | Status: DC
Start: 2024-04-24 — End: 2024-06-29

## 2024-04-24 MED ORDER — NURTEC 75 MG PO TBDP
1.0000 | ORAL_TABLET | ORAL | 3 refills | Status: AC | PRN
Start: 2024-04-24 — End: ?

## 2024-04-24 MED ORDER — HYDROCORTISONE ACETATE 25 MG RE SUPP
25.0000 mg | Freq: Two times a day (BID) | RECTAL | 0 refills | Status: DC
Start: 2024-04-24 — End: 2024-10-19

## 2024-04-24 NOTE — Progress Notes (Signed)
 New patient visit   Patient: Dawn Cain   DOB: October 09, 1981   43 y.o. Female  MRN: [id number] Visit Date: 04/24/2024  Today's healthcare provider: Carlean Charter, DO   Chief Complaint  Patient presents with   Establish Care    Patient has high scores for depression, anxiety and stress screening questionnaire.     Hemorrhoids    Patient has had external hemorrhoids for 3-4 weeks with some days bleeding.  She was seen by urgent care about 1 week ago and was given hydrocortisone cream.  She reports very minimal relief from the cream.   Subjective    Dawn Cain is a 43 y.o. female who presents today as a new patient to establish care.  HPI HPI     Establish Care    Additional comments: Patient has high scores for depression, anxiety and stress screening questionnaire.          Hemorrhoids    Additional comments: Patient has had external hemorrhoids for 3-4 weeks with some days bleeding.  She was seen by urgent care about 1 week ago and was given hydrocortisone cream.  She reports very minimal relief from the cream.      Last edited by Eustaquio Hight, CMA on 04/24/2024  9:21 AM.       Dawn Cain is a 43 year old female who presents with gastrointestinal issues.  She experiences ongoing gastrointestinal issues characterized by alternating constipation and diarrhea. Diarrhea occurs predominantly in the mornings, often with multiple episodes before leaving the house (reports 8 before leaving home today). Occasionally, she has hard stools that are painful and require straining, sometimes resulting in bleeding from hemorrhoids. She has not been evaluated by gastroenterology before and has not had a colonoscopy. She uses stool softeners and MiraLAX as needed but is unsure about her use during episodes of diarrhea. No blood in her stool except for occasional bleeding from hemorrhoids. She reports nausea, especially in the mornings.  She has a history of anxiety and depression,  with high scores on recent screenings. She has seen various therapists over the years and currently attends therapy sessions every other week. She has not started any medication for this and recalls a previous prescription for escitalopram  (Lexapro ) that was never filled. No history of self-harm or harm to others.  She experiences migraines a couple of times a month and has used Nurtec in the past, though her insurance stopped covering it last year. She has a supply left from previous prescriptions. Prior treatments included Imitrex and Axert , which she discontinued due to adverse effects. Migraines can last from a few hours to a day, with associated nausea and sometimes vomiting. She occasionally wakes up with migraines. - Previously tried imitrex until she developed sensation of not being able to swallow, then took axert  until she developed the same symptoms.  She uses a multivitamin when she remembers and takes Tylenol  and ibuprofen  as needed. She also experiences reflux, which she manages with medication once or twice a month.     Past Medical History:  Diagnosis Date   Depression    Endometriosis 02/27/2014   dx'd laparoscopically     GERD (gastroesophageal reflux disease)    Headache    Migraines - 2-3x/month   Heel spur, right    Infertility    trying for 20 years.   Lipoma of back    Patellar tendonitis 12/15/2016   PONV (postoperative nausea and vomiting)    Past  Surgical History:  Procedure Laterality Date   ACHILLES TENDON SURGERY Right 04/09/2017   Procedure: ACHILLES TENDON REPAIR;  Surgeon: Anell Baptist, DPM;  Location: [facility];  Service: Podiatry;  Laterality: Right;   GANGLION CYST EXCISION Right 04/09/2021   Procedure: EXCISION OF MUCOID CYST FROM RIGHT LONG FINGER;  Surgeon: Elner Hahn, MD;  Location: [facility];  Service: Orthopedics;  Laterality: Right;   HEEL SPUR SURGERY  03/2017   04/09/2017   KNEE ARTHROSCOPY WITH MENISCAL REPAIR Right 07/18/2021   Procedure:  Right knee arthroscopic medial meniscus root repair and chondroplasty;  Surgeon: Lorri Rota, MD;  Location: [facility];  Service: Orthopedics;  Laterality: Right;   KNEE CLOSED REDUCTION Right 09/16/2021   Procedure: Right knee arthroscopy, lysis of adhesions, partial medial menisectomy and manipulation under anesthesia;  Surgeon: Lorri Rota, MD;  Location: [facility];  Service: Orthopedics;  Laterality: Right;   LEFT OOPHORECTOMY Left 2013   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   LIPOMA EXCISION Left 10/31/2015   Procedure: EXCISION LIPOMA;  Surgeon: Gwyndolyn Lerner, MD;  Location: [facility];  Service: General;  Laterality: Left;   MYOMECTOMY N/A 08/25/2023   Procedure: ABDOMINAL MYOMECTOMY;  Surgeon: Terri Fester, MD;  Location: [facility];  Service: Gynecology;  Laterality: N/A;   OSTECTOMY Right 04/09/2017   Procedure: OSTECTOMY-[place]/Retrocalcaneal;  Surgeon: Anell Baptist, DPM;  Location: [facility];  Service: Podiatry;  Laterality: Right;   OVARIAN CYST REMOVAL Left 05/02/2009   OVARIAN CYST REMOVAL Left 09/23/2006   Family Status  Relation Name Status   Father  Alive   Mother  Alive   MGM  (Not Specified)   MGF  (Not Specified)   PGM  (Not Specified)   PGF  (Not Specified)  No partnership data on file   Family History  Problem Relation Age of Onset   Hypertension Father    Emphysema Maternal Grandmother    Cancer Maternal Grandfather        Lung   Heart disease Paternal Grandmother    Cancer Paternal Grandfather        stomach   Social History   Socioeconomic History   Marital status: Married    Spouse name: Emeterio Hansen   Number of children: 0   Years of education: Not on file   Highest education level: Not on file  Occupational History   Not on file  Tobacco Use   Smoking status: Never    Passive exposure: Never   Smokeless tobacco: Never  Vaping Use   Vaping status: Never Used  Substance and Sexual Activity   Alcohol use: Yes    Alcohol/week:  2.0 standard drinks of alcohol    Types: 2 Standard drinks or equivalent per week    Comment: rare   Drug use: No   Sexual activity: Yes    Partners: Male    Birth control/protection: None  Other Topics Concern   Not on file  Social History Narrative   Not on file   Social Drivers of Health   Financial Resource Strain: Low Risk  (04/24/2024)   Overall Financial Resource Strain (CARDIA)    Difficulty of Paying Living Expenses: Not hard at all  Food Insecurity: No Food Insecurity (04/24/2024)   Hunger Vital Sign    Worried About Running Out of Food in the Last Year: Never true    Ran Out of Food in the Last Year: Never true  Transportation Needs: No Transportation Needs (04/24/2024)   PRAPARE - Transportation  Lack of Transportation (Medical): No    Lack of Transportation (Non-Medical): No  Physical Activity: Insufficiently Active (04/24/2024)   Exercise Vital Sign    Days of Exercise per Week: 2 days    Minutes of Exercise per Session: 30 min  Stress: Stress Concern Present (04/24/2024)   [employer] of Occupational Health - Occupational Stress Questionnaire    Feeling of Stress : Very much  Social Connections: Socially Integrated (04/24/2024)   Social Connection and Isolation Panel [NHANES]    Frequency of Communication with Friends and Family: More than three times a week    Frequency of Social Gatherings with Friends and Family: Never    Attends Religious Services: More than 4 times per year    Active Member of [employer] or Organizations: Yes    Attends Engineer, structural: More than 4 times per year    Marital Status: Married   Outpatient Medications Prior to Visit  Medication Sig   acetaminophen  (TYLENOL ) 325 MG tablet Take 2 tablets (650 mg total) by mouth every 4 (four) hours as needed for mild pain (temperature > 101.5.).   hydrocortisone-pramoxine (ANALPRAM-HC) 2.5-1 % rectal cream Place 1 Application rectally 3 (three) times daily.   ibuprofen  (ADVIL ) 600  MG tablet Take 1 tablet (600 mg total) by mouth every 6 (six) hours.   meloxicam (MOBIC) 15 MG tablet Take 15 mg by mouth daily as needed for pain.   Multiple Vitamins-Minerals (MULTIVITAMIN WITH MINERALS) tablet Take 1 tablet by mouth 3 (three) times a week.   [DISCONTINUED] oxyCODONE  (OXY IR/ROXICODONE ) 5 MG immediate release tablet Take 1-2 tablets (5-10 mg total) by mouth every 4 (four) hours as needed for moderate pain.   No facility-administered medications prior to visit.   Allergies  Allergen Reactions   Erythromycin Shortness Of Breath and Swelling    Chest pain   Imitrex [Sumatriptan]     neck muscle tightness    Immunization History  Administered Date(s) Administered   Tdap 12/15/2005, 12/13/2019    Health Maintenance  Topic Date Due   COVID-19 Vaccine (1 - 2024-25 season) 09/13/2024 (Originally 08/15/2023)   INFLUENZA VACCINE  07/14/2024   Cervical Cancer Screening (HPV/Pap Cotest)  02/13/2027   DTaP/Tdap/Td (3 - Td or Tdap) 12/12/2029   Hepatitis C Screening  Completed   HPV VACCINES  Aged Out   Meningococcal B Vaccine  Aged Out   HIV Screening  Discontinued    Patient Care Team: Jocabed Cheese, Asencion Blacksmith, DO as PCP - General (Family Medicine) Raynell Caller, MD as Consulting Physician (Obstetrics and Gynecology) Crystal Dory (Unknown Physician Specialty)  Review of Systems  Constitutional:  Negative for appetite change, chills, fatigue and fever.  Respiratory:  Negative for chest tightness and shortness of breath.   Cardiovascular:  Negative for chest pain and palpitations.  Gastrointestinal:  Positive for constipation, diarrhea and nausea (intermittent). Negative for abdominal pain and vomiting.  Neurological:  Negative for dizziness and weakness.  Psychiatric/Behavioral:  Positive for dysphoric mood. The patient is nervous/anxious.         Objective    BP 138/81 (BP Location: Right Arm, Patient Position: Sitting, Cuff Size: Normal)   Pulse 68   Temp 99.1  F (37.3 C) (Oral)   Ht 5\' 4"  (1.626 m)   Wt 241 lb (109.3 kg)   SpO2 99%   BMI 41.37 kg/m     Physical Exam Vitals and nursing note reviewed.  Constitutional:      General: She is not in acute distress.  Appearance: Normal appearance.  HENT:     Head: Normocephalic and atraumatic.  Eyes:     General: No scleral icterus.    Conjunctiva/sclera: Conjunctivae normal.  Cardiovascular:     Rate and Rhythm: Normal rate.  Pulmonary:     Effort: Pulmonary effort is normal.  Abdominal:     Comments: +external hemorrhoids; unable to assess for internal due to patient's severe pain on exam.  Neurological:     Mental Status: She is alert and oriented to person, place, and time. Mental status is at baseline.  Psychiatric:        Mood and Affect: Mood normal.        Behavior: Behavior normal.     Depression Screen    04/24/2024    9:12 AM 05/04/2022    1:16 PM 01/21/2022    2:15 PM 11/27/2021   11:11 AM  PHQ 2/9 Scores  PHQ - 2 Score 6 0 0 0  PHQ- 9 Score 16 0 2 2   No results found for any visits on 04/24/24.  Assessment & Plan     Alternating constipation and diarrhea -     Ambulatory referral to Gastroenterology  Establishing care with new doctor, encounter for  Migraine without aura and without status migrainosus, not intractable -     Nurtec; Take 1 tablet (75 mg total) by mouth every other day as needed (migraines).  Dispense: 16 tablet; Refill: 3  Moderate recurrent major depression (HCC) -     Escitalopram  Oxalate; Take 1 tablet (10 mg total) by mouth daily.  Dispense: 30 tablet; Refill: 1  External hemorrhoids -     Hydrocortisone Acetate; Place 1 suppository (25 mg total) rectally 2 (two) times daily.  Dispense: 12 suppository; Refill: 0 -     Ambulatory referral to Gastroenterology  Generalized anxiety disorder -     Escitalopram  Oxalate; Take 1 tablet (10 mg total) by mouth daily.  Dispense: 30 tablet; Refill: 1    Depression; anxiety High anxiety and  depression scores. Open to escitalopram .  Addressed husband's concerns about medication.  Denies SI/HI. - Prescribe escitalopram . - Contracted for safety. - Discuss potential side effects and plan to switch if adverse effects occur.  Migraine Migraines with nausea and vomiting. Nurtec effective but insurance coverage uncertain. Adverse effects with Imitrex and Axert . - Attempt to prescribe Nurtec and check insurance coverage.  Alternating diarrhea and constipation Fluctuating bowel habits, predominantly diarrhea. Nausea and hemorrhoidal bleeding.  Has not previously been evaluated but may be consistent with IBS.  Antidepressants may benefit if so. Referred to gastroenterology. - Refer to gastroenterology for evaluation and possible colonoscopy. - Prescribe suppositories for hemorrhoid management, noting insurance coverage may be an issue. - Advise internal application of cream for hemorrhoids if unable to obtain suppositories. - Consider x-ray to evaluate for potential blockage.  Gastroesophageal Reflux Disease (GERD) Infrequent reflux (1-2 times per month), well-managed with Tums.  General Health Maintenance Discussed COVID booster and HIV screening post-needle stick. - Advise annual COVID booster vaccination.    Return in about 4 weeks (around 05/22/2024) for Chronic f/u.     I discussed the assessment and treatment plan with the patient  The patient was provided an opportunity to ask questions and all were answered. The patient agreed with the plan and demonstrated an understanding of the instructions.   The patient was advised to call back or seek an in-person evaluation if the symptoms worsen or if the condition fails to improve as anticipated.  Carlean Charter, DO  [facility] [phone] (phone) [phone] (fax)  [facility]

## 2024-04-24 NOTE — Telephone Encounter (Signed)
 [facility] pharmacy is requesting refill Nurtec 75 mg ODT  Tablets Please advise

## 2024-04-24 NOTE — Telephone Encounter (Signed)
 Rx sent today E-Prescribing Status: Receipt confirmed by pharmacy (04/24/2024  9:45 AM EDT)

## 2024-04-25 ENCOUNTER — Telehealth: Payer: Self-pay

## 2024-04-25 ENCOUNTER — Other Ambulatory Visit (HOSPITAL_COMMUNITY): Payer: Self-pay

## 2024-04-25 NOTE — Telephone Encounter (Signed)
 Pharmacy Patient Advocate Encounter  Received notification from [facility] that Prior Authorization for Nurtec 75MG  dispersible tablets has been APPROVED from 04/25/24 to 10/26/24. Ran test claim, Copay is $0. This test claim was processed through [facility]- copay amounts may vary at other pharmacies due to pharmacy/plan contracts, or as the patient moves through the different stages of their insurance plan.   PA #/Case ID/Reference #: [id number]

## 2024-04-25 NOTE — Telephone Encounter (Signed)
 Pharmacy Patient Advocate Encounter   Received notification from Onbase that prior authorization for Nurtec 75MG  dispersible tablets is required/requested.   Insurance verification completed.   The patient is insured through [facility] .   Per test claim: PA required; PA submitted to above mentioned insurance via CoverMyMeds Key/confirmation #/EOC [facility] Status is pending

## 2024-05-24 ENCOUNTER — Ambulatory Visit: Admitting: Family Medicine

## 2024-05-24 VITALS — BP 113/64 | HR 78 | Resp 16 | Ht 64.0 in | Wt 237.5 lb

## 2024-05-24 NOTE — Progress Notes (Unsigned)
      Established patient visit   Patient: Dawn Cain   DOB: [birth year]   43 y.o. Female  MRN: [id number] Visit Date: 05/24/2024  Today's healthcare provider: Carlean Charter, DO   No chief complaint on file.  Subjective    HPI   Escitalopram  follow-up ***  {History (Optional):23778}  Medications: Outpatient Medications Prior to Visit  Medication Sig   acetaminophen  (TYLENOL ) 325 MG tablet Take 2 tablets (650 mg total) by mouth every 4 (four) hours as needed for mild pain (temperature > 101.5.).   escitalopram  (LEXAPRO ) 10 MG tablet Take 1 tablet (10 mg total) by mouth daily.   hydrocortisone  (ANUSOL -HC) 25 MG suppository Place 1 suppository (25 mg total) rectally 2 (two) times daily.   hydrocortisone -pramoxine (ANALPRAM-HC) 2.5-1 % rectal cream Place 1 Application rectally 3 (three) times daily.   ibuprofen  (ADVIL ) 600 MG tablet Take 1 tablet (600 mg total) by mouth every 6 (six) hours.   meloxicam (MOBIC) 15 MG tablet Take 15 mg by mouth daily as needed for pain.   Multiple Vitamins-Minerals (MULTIVITAMIN WITH MINERALS) tablet Take 1 tablet by mouth 3 (three) times a week.   Rimegepant Sulfate (NURTEC) 75 MG TBDP Take 1 tablet (75 mg total) by mouth every other day as needed (migraines).   No facility-administered medications prior to visit.    Review of Systems ***  {Insert previous labs (optional):23779} {See past labs  Heme  Chem  Endocrine  Serology  Results Review (optional):1}   Objective    There were no vitals taken for this visit. {Insert last BP/Wt (optional):23777}{See vitals history (optional):1}   Physical Exam   No results found for any visits on 05/24/24.  Assessment & Plan    There are no diagnoses linked to this encounter.  ***  No follow-ups on file.      I discussed the assessment and treatment plan with the patient  The patient was provided an opportunity to ask questions and all were answered. The patient agreed with the plan and  demonstrated an understanding of the instructions.   The patient was advised to call back or seek an in-person evaluation if the symptoms worsen or if the condition fails to improve as anticipated.    Carlean Charter, DO  [facility] [phone] (phone) [phone] (fax)  [facility]

## 2024-06-13 ENCOUNTER — Ambulatory Visit: Admitting: Gastroenterology

## 2024-06-13 ENCOUNTER — Other Ambulatory Visit (INDEPENDENT_AMBULATORY_CARE_PROVIDER_SITE_OTHER)

## 2024-06-13 ENCOUNTER — Encounter: Payer: Self-pay | Admitting: Gastroenterology

## 2024-06-13 VITALS — BP 112/74 | HR 72 | Ht 63.75 in | Wt 239.2 lb

## 2024-06-13 DIAGNOSIS — K602 Anal fissure, unspecified: Secondary | ICD-10-CM

## 2024-06-13 DIAGNOSIS — K625 Hemorrhage of anus and rectum: Secondary | ICD-10-CM

## 2024-06-13 DIAGNOSIS — Z1211 Encounter for screening for malignant neoplasm of colon: Secondary | ICD-10-CM

## 2024-06-13 DIAGNOSIS — R194 Change in bowel habit: Secondary | ICD-10-CM

## 2024-06-13 DIAGNOSIS — K6289 Other specified diseases of anus and rectum: Secondary | ICD-10-CM | POA: Diagnosis not present

## 2024-06-13 MED ORDER — AMBULATORY NON FORMULARY MEDICATION: 1 refills | Status: DC

## 2024-06-13 MED ORDER — NA SULFATE-K SULFATE-MG SULF 17.5-3.13-1.6 GM/177ML PO SOLN: 1.0000 | Freq: Once | ORAL | 0 refills | Status: AC

## 2024-06-13 NOTE — Progress Notes (Signed)
 Chief Complaint:alternating constipation and diarrhea, external hemorrhoids .   Primary GI Doctor: Dr. Federico  HPI:  Patient is a  43  year old female patient with past medical history of GERD, depression,anxiety, and migraines, who was referred to me by Donzella Lauraine SAILOR, DO on 04/24/24 for a complaint of alternating constipation and diarrhea, external hemorrhoids .    Interval History     Patient presents with main complaint of alternated BM's and hemorrhoids. She states she has had altered BM's for at least 5 years or more. She states 75% of the time the stool is loose and the other 25% is constipation. She admits to a lot of straining. She will have urge to go to restroom several times back to back without feeling she completely empties. She has had intermittent rectal bleeding with wiping. She states back in April when she wiped it was significant amount on toilet paper. She has used topical and suppositories hydrocortisone  with some improvement but she states it still does not feel right.    She does note the first three weeks she was started on Lexapro  10 mg po daily for anxiety and depression her bowel habits were more normal. She does note stress can flare up symptoms. She states sometimes spicy foods increases the symptoms and other times it does not. Patient denies nausea, vomiting, or weight loss  Nonsmoker. Drinks 1-2 times a year.   She takes Meloxicam prn for knee pain.  Never had EGD or colonoscopy. Never seen by gastroenterologist.   Family hx: Paternal grandfather with stomach CA, paternal grandfather with lung CA  Surgical hx: Ovarian cysts removal x3, ovary and fallopian tube removed, uterine fibroids removed, knee meniscus repair, heel spur  Wt Readings from Last 3 Encounters:  06/13/24 239 lb 4 oz (108.5 kg)  05/24/24 237 lb 8 oz (107.7 kg)  04/24/24 241 lb (109.3 kg)     Past Medical History:  Diagnosis Date   Anxiety    Depression    Endometriosis 02/27/2014    dx'd laparoscopically     GERD (gastroesophageal reflux disease)    Headache    Migraines - 2-3x/month   Heel spur, right    Infertility    trying for 20 years.   Lipoma of back    Patellar tendonitis 12/15/2016   PONV (postoperative nausea and vomiting)     Past Surgical History:  Procedure Laterality Date   ACHILLES TENDON SURGERY Right 04/09/2017   Procedure: ACHILLES TENDON REPAIR;  Surgeon: Eva Gay, DPM;  Location: [facility];  Service: Podiatry;  Laterality: Right;   GANGLION CYST EXCISION Right 04/09/2021   Procedure: EXCISION OF MUCOID CYST FROM RIGHT LONG FINGER;  Surgeon: Edie Norleen PARAS, MD;  Location: [facility];  Service: Orthopedics;  Laterality: Right;   HEEL SPUR SURGERY  03/2017   04/09/2017   KNEE ARTHROSCOPY WITH MENISCAL REPAIR Right 07/18/2021   Procedure: Right knee arthroscopic medial meniscus root repair and chondroplasty;  Surgeon: Tobie Priest, MD;  Location: [facility];  Service: Orthopedics;  Laterality: Right;   KNEE CLOSED REDUCTION Right 09/16/2021   Procedure: Right knee arthroscopy, lysis of adhesions, partial medial menisectomy and manipulation under anesthesia;  Surgeon: Tobie Priest, MD;  Location: [facility];  Service: Orthopedics;  Laterality: Right;   LEFT OOPHORECTOMY Left 2013   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   LIPOMA EXCISION Left 10/31/2015   Procedure: EXCISION LIPOMA;  Surgeon: Carlin Pastel, MD;  Location: [facility];  Service: General;  Laterality: Left;   MYOMECTOMY N/A 08/25/2023   Procedure: ABDOMINAL MYOMECTOMY;  Surgeon: Barbette Knock, MD;  Location: [facility];  Service: Gynecology;  Laterality: N/A;   OSTECTOMY Right 04/09/2017   Procedure: OSTECTOMY-[place]/Retrocalcaneal;  Surgeon: Eva Gay, DPM;  Location: [facility];  Service: Podiatry;  Laterality: Right;   OVARIAN CYST REMOVAL Left 05/02/2009   OVARIAN CYST REMOVAL Left 09/23/2006    Current Outpatient Medications  Medication Sig Dispense Refill    acetaminophen  (TYLENOL ) 325 MG tablet Take 2 tablets (650 mg total) by mouth every 4 (four) hours as needed for mild pain (temperature > 101.5.).     AMBULATORY NON FORMULARY MEDICATION Medication Name: Diltiazem 2% with lidocaine . Apply to rectum 3x daily 30 g 1   escitalopram  (LEXAPRO ) 10 MG tablet Take 1 tablet (10 mg total) by mouth daily. 30 tablet 1   hydrocortisone  (ANUSOL -HC) 25 MG suppository Place 1 suppository (25 mg total) rectally 2 (two) times daily. 12 suppository 0   hydrocortisone -pramoxine (ANALPRAM-HC) 2.5-1 % rectal cream Place 1 Application rectally 3 (three) times daily.     ibuprofen  (ADVIL ) 600 MG tablet Take 1 tablet (600 mg total) by mouth every 6 (six) hours. 30 tablet 0   meloxicam (MOBIC) 15 MG tablet Take 15 mg by mouth daily as needed for pain.     Multiple Vitamins-Minerals (MULTIVITAMIN WITH MINERALS) tablet Take 1 tablet by mouth 3 (three) times a week.     Na Sulfate-K Sulfate-Mg Sulfate concentrate (SUPREP) 17.5-3.13-1.6 GM/177ML SOLN Take 1 kit (354 mLs total) by mouth once for 1 dose. 354 mL 0   Rimegepant Sulfate (NURTEC) 75 MG TBDP Take 1 tablet (75 mg total) by mouth every other day as needed (migraines). 16 tablet 3   No current facility-administered medications for this visit.    Allergies as of 06/13/2024 - Review Complete 06/13/2024  Allergen Reaction Noted   Erythromycin Shortness Of Breath and Swelling 02/27/2014   Imitrex [sumatriptan]  06/05/2015    Family History  Problem Relation Age of Onset   Hypertension Father    Hyperlipidemia Father    Emphysema Maternal Grandmother    Lung cancer Maternal Grandfather    Heart disease Paternal Grandmother    Stomach cancer Paternal Grandfather     Review of Systems:    Constitutional: No weight loss, fever, chills, weakness or fatigue HEENT: Eyes: No change in vision               Ears, Nose, Throat:  No change in hearing or congestion Skin: No rash or itching Cardiovascular: No chest pain,  chest pressure or palpitations   Respiratory: No SOB or cough Gastrointestinal: See HPI and otherwise negative Genitourinary: No dysuria or change in urinary frequency Neurological: No headache, dizziness or syncope Musculoskeletal: No new muscle or joint pain Hematologic: No bleeding or bruising Psychiatric: No history of depression or anxiety    Physical Exam:  Vital signs: BP 112/74 (BP Location: Left Arm, Patient Position: Sitting, Cuff Size: Large)   Pulse 72   Ht 5' 3.75 (1.619 m) Comment: height measured without shoes  Wt 239 lb 4 oz (108.5 kg)   LMP 05/30/2024   BMI 41.39 kg/m   Constitutional:   Pleasant  female appears to be in NAD, Well developed, Well nourished, alert and cooperative Throat: Oral cavity and pharynx without inflammation, swelling or lesion.  Respiratory: Respirations even and unlabored. Lungs clear to auscultation bilaterally.   No wheezes, crackles, or rhonchi.  Cardiovascular: Normal S1, S2. Regular rate and  rhythm. No peripheral edema, cyanosis or pallor.  Gastrointestinal:  Soft, nondistended, nontender. No rebound or guarding. Normal bowel sounds. No appreciable masses or hepatomegaly. Rectal: external rectal exam with skin tags, normal rectal tone, DRE tender unable to do full exam, hemoccult N/A  (chaperone Sophia CMA) Anoscopy: not performed, too much pain during DRE Msk:  Symmetrical without gross deformities. Without edema, no deformity or joint abnormality.  Neurologic:  Alert and  oriented x4;  grossly normal neurologically.  Skin:   Dry and intact without significant lesions or rashes. Psychiatric: Oriented to person, place and time. Demonstrates good judgement and reason without abnormal affect or behaviors.  RELEVANT LABS AND IMAGING: CBC    Latest Ref Rng & Units 08/26/2023    3:27 AM 08/18/2023    3:30 PM 03/31/2021    7:21 AM  CBC  WBC 4.0 - 10.5 K/uL 11.4  6.8  6.0   Hemoglobin 12.0 - 15.0 g/dL 89.1  88.6  87.1   Hematocrit 36.0 -  46.0 % 34.5  36.2  39.1   Platelets 150 - 400 K/uL 221  248  211      CMP     Latest Ref Rng & Units 08/18/2023    3:30 PM 03/31/2021    7:21 AM 09/12/2019    9:38 AM  CMP  Glucose 70 - 99 mg/dL 897  90  95   BUN 6 - 20 mg/dL 11  12  13    Creatinine 0.44 - 1.00 mg/dL 9.27  9.32  9.22   Sodium 135 - 145 mmol/L 140  139  141   Potassium 3.5 - 5.1 mmol/L 4.2  4.2  4.6   Chloride 98 - 111 mmol/L 106  105  104   CO2 22 - 32 mmol/L 25  19  25    Calcium 8.9 - 10.3 mg/dL 9.6  9.0  9.6   Total Protein 6.0 - 8.5 g/dL  7.0  7.6   Total Bilirubin 0.0 - 1.2 mg/dL  0.3  0.5   Alkaline Phos 44 - 121 IU/L  57  67   AST 0 - 40 IU/L  16  15   ALT 0 - 32 IU/L  16  13      Lab Results  Component Value Date   TSH 3.100 03/31/2021     Assessment: Encounter Diagnoses  Name Primary?   Altered bowel habits Yes   Anal fissure    Rectal bleeding    Rectal pain    Special screening for malignant neoplasms, colon      43 year old female patient who presents with altered bowel habits, intermittent rectal bleeding, and rectal pain. Upon examination it was noted she had anal fissure, unable to do anoscopy due to discomfort. We will initially treat with topical diltiazem with lidocaine  TID then proceed with colonoscopy for surveillance screening purposes. The bleeding could potentially be from anal fissure, unable to do exam with anoscopy to evaluate for internal hemorrhoids. External skin tags noted and we discussed surgical options, she would like to hold off for now. Will check lab work for thyroid disease, inflammatory disease, or celiac disease. Recommended low fod map diet. If negative workup Keandrea Tapley be functional IBS. Jared Whorley benefit from pelvic floor therapy in future.  Plan: - Recommend Low fodmap diet  -start Citrucel 1 tsp po daily -Check CRP, TTG IgA, IgA, TSH  -Start topical diltiazem with lidocaine  TID -Sitz baths TID -Schedule for a colonoscopy in [facility] with Dr. Federico at age  43. The risks and  benefits of colonoscopy with possible polypectomy / biopsies were discussed and the patient agrees to proceed. Consider hemorrhoid banding if internal hemorrhoids present - We discussed surgical referral for external skin tags, she would like to hold off for now.  Thank you for the courtesy of this consult. Please call me with any questions or concerns.   Alfonso Shackett, FNP-C [facility] 06/13/2024, 9:43 AM  Cc: Donzella Lauraine SAILOR, DO

## 2024-06-13 NOTE — Progress Notes (Signed)
 I agree with the assessment and plan as outlined by Ms. May.

## 2024-06-13 NOTE — Patient Instructions (Addendum)
 Altered bowel habits Recommend low fodmap diet Start Citrucel 1 tsp po daily  Anal fissure Apply topical  diltiazem with lidocaine  3x daily with gloved finger Sitz baths two to three times a day Clean area clean and dry No straining or pushing  External skin tags Would need referral to colon rectal surgeon for removal.   Your provider has requested that you go to the basement level for lab work before leaving today. Press B on the elevator. The lab is located at the first door on the left as you exit the elevator.   We have sent the following medications to your pharmacy for you to pick up at your convenience: Diltiazem 2%. We have sent a prescription for Diltiazem 2%  gel to [facility]. You should apply a pea size amount to your rectum three times daily x 6-8 weeks.  [facility]'s information is below: Address: [address], [place], [place] [address]  [phone]  *Please DO NOT go directly from our office to pick up this medication! Give the pharmacy 1 day to process the prescription as this is compounded and takes time to make.   You have been scheduled for a colonoscopy. Please follow written instructions given to you at your visit today.   If you use inhalers (even only as needed), please bring them with you on the day of your procedure.  DO NOT TAKE 7 DAYS PRIOR TO TEST- Trulicity (dulaglutide) Ozempic, Wegovy (semaglutide) Mounjaro (tirzepatide) Bydureon Bcise (exanatide extended release)  DO NOT TAKE 1 DAY PRIOR TO YOUR TEST Rybelsus (semaglutide) Adlyxin (lixisenatide) Victoza (liraglutide) Byetta (exanatide) ___________________________________________________________________________  Due to recent changes in healthcare laws, you may see the results of your imaging and laboratory studies on MyChart before your provider has had a chance to review them.  We understand that in some cases there may be results that are confusing or  concerning to you. Not all laboratory results come back in the same time frame and the provider may be waiting for multiple results in order to interpret others.  Please give us  48 hours in order for your provider to thoroughly review all the results before contacting the office for clarification of your results.   _______________________________________________________  If your blood pressure at your visit was 140/90 or greater, please contact your primary care physician to follow up on this.  _______________________________________________________  If you are age 59 or older, your body mass index should be between 23-30. Your Body mass index is 41.39 kg/m. If this is out of the aforementioned range listed, please consider follow up with your Primary Care Provider.  If you are age 41 or younger, your body mass index should be between 19-25. Your Body mass index is 41.39 kg/m. If this is out of the aformentioned range listed, please consider follow up with your Primary Care Provider.   ________________________________________________________  The [facility] providers would like to encourage you to use MYCHART to communicate with providers for non-urgent requests or questions.  Due to long hold times on the telephone, sending your provider a message by [facility] may be a faster and more efficient way to get a response.  Please allow 48 business hours for a response.  Please remember that this is for non-urgent requests.  _______________________________________________________  Thank you for trusting me with your gastrointestinal care. Deanna May, RNP

## 2024-06-16 LAB — TSH: TSH: 2.69 u[IU]/mL (ref 0.450–4.500)

## 2024-06-16 LAB — TISSUE TRANSGLUTAMINASE ABS,IGG,IGA
Tissue Transglut Ab: 4 U/mL (ref 0–5)
Transglutaminase IgA: <2 U/mL (ref 0–3)

## 2024-06-16 LAB — IGA: IgA/Immunoglobulin A, Serum: 181 mg/dL (ref 87–352)

## 2024-06-16 LAB — C-REACTIVE PROTEIN: CRP: 3 mg/L (ref 0–10)

## 2024-06-19 ENCOUNTER — Ambulatory Visit: Payer: Self-pay | Admitting: Gastroenterology

## 2024-06-22 ENCOUNTER — Encounter: Payer: Self-pay | Admitting: Family Medicine

## 2024-06-23 ENCOUNTER — Ambulatory Visit: Payer: Self-pay

## 2024-06-23 NOTE — Telephone Encounter (Signed)
 FYI Only or Action Required?: Action required by provider: clinical question for provider. Awaiting follow up from 06/22/24 patient message  Patient was last seen in primary care on 04/24/2024 by Donzella Lauraine SAILOR, DO.  Called Nurse Triage reporting Duplicate.  Symptoms began awaiting follow up, no triage.  Interventions attempted: Other: na.  Symptoms are: na.  Triage Disposition: No Contact Calls  Patient/caregiver understands and will follow disposition?:   Copied from CRM [id number]. Topic: Clinical - Medication Question >> Jun 23, 2024  3:23 PM Montie POUR wrote: Reason for CRM:  Please call Tim about note sent in MyChart yesterday to discuss what to do about medication question.  His number [phone] Reason for Disposition  NON-URGENT call redirected to PCP's office because it is open    Requesting follow up to message sent 06/22/24. See patient message encounter.  Protocols used: No Contact or Duplicate Contact Call-A-AH

## 2024-06-23 NOTE — Telephone Encounter (Signed)
 Please see the message below and the attached message that was sent on 7/10 and advise

## 2024-06-27 ENCOUNTER — Telehealth: Payer: Self-pay | Admitting: Family Medicine

## 2024-06-27 NOTE — Telephone Encounter (Unsigned)
 Copied from CRM [id number]. Topic: Clinical - Medication Question >> Jun 27, 2024  4:43 PM Gennette ORN wrote: Reason for CRM: Patient calling in to see if medication has been sent over from which was discussed about between the dr and patient.

## 2024-06-29 ENCOUNTER — Telehealth: Payer: Self-pay

## 2024-06-29 ENCOUNTER — Other Ambulatory Visit: Payer: Self-pay

## 2024-06-29 MED ORDER — ESCITALOPRAM OXALATE 20 MG PO TABS: 20.0000 mg | ORAL_TABLET | Freq: Every day | ORAL | 2 refills | Status: DC

## 2024-06-29 NOTE — Telephone Encounter (Signed)
 Medication has been sent in.  Called pt to inform pt verbalized understanding.

## 2024-06-29 NOTE — Telephone Encounter (Signed)
 Copied from CRM [id number]. Topic: General - Other >> Jun 29, 2024 11:53 AM Marissa P wrote: Reason for CRM: Patient called to speak to a nurse of doctor Pardue, please call her back

## 2024-07-20 ENCOUNTER — Ambulatory Visit: Admitting: Gastroenterology

## 2024-08-09 ENCOUNTER — Encounter: Payer: Self-pay | Admitting: Internal Medicine

## 2024-08-10 ENCOUNTER — Encounter: Payer: Self-pay | Admitting: Gastroenterology

## 2024-08-10 ENCOUNTER — Ambulatory Visit (INDEPENDENT_AMBULATORY_CARE_PROVIDER_SITE_OTHER): Admitting: Gastroenterology

## 2024-08-10 ENCOUNTER — Other Ambulatory Visit: Payer: Self-pay | Admitting: Obstetrics & Gynecology

## 2024-08-10 VITALS — BP 116/80 | HR 84 | Ht 63.75 in | Wt 242.4 lb

## 2024-08-10 DIAGNOSIS — R928 Other abnormal and inconclusive findings on diagnostic imaging of breast: Secondary | ICD-10-CM

## 2024-08-10 DIAGNOSIS — K6289 Other specified diseases of anus and rectum: Secondary | ICD-10-CM | POA: Diagnosis not present

## 2024-08-10 DIAGNOSIS — R194 Change in bowel habit: Secondary | ICD-10-CM | POA: Diagnosis not present

## 2024-08-10 DIAGNOSIS — K625 Hemorrhage of anus and rectum: Secondary | ICD-10-CM | POA: Diagnosis not present

## 2024-08-10 DIAGNOSIS — K602 Anal fissure, unspecified: Secondary | ICD-10-CM

## 2024-08-10 DIAGNOSIS — Z1211 Encounter for screening for malignant neoplasm of colon: Secondary | ICD-10-CM

## 2024-08-10 NOTE — Patient Instructions (Addendum)
 Recommend Low fodmap diet  Continue Citrucel 1 tsp po daily Continue topical diltiazem with lidocaine  TID another 2-4 weeks, if continuous issues please let office know Stool softeners as needed Sitz baths TID Use wet wipes   _______________________________________________________  If your blood pressure at your visit was 140/90 or greater, please contact your primary care physician to follow up on this.  _______________________________________________________  If you are age 45 or older, your body mass index should be between 23-30. Your Body mass index is 41.93 kg/m. If this is out of the aforementioned range listed, please consider follow up with your Primary Care Provider.  If you are age 40 or younger, your body mass index should be between 19-25. Your Body mass index is 41.93 kg/m. If this is out of the aformentioned range listed, please consider follow up with your Primary Care Provider.   ________________________________________________________  The [facility] providers would like to encourage you to use MYCHART to communicate with providers for non-urgent requests or questions.  Due to long hold times on the telephone, sending your provider a message by [facility] may be a faster and more efficient way to get a response.  Please allow 48 business hours for a response.  Please remember that this is for non-urgent requests.  _______________________________________________________  [facility] is using a team-based approach to care.  Your team is made up of your doctor and two to three APPS. Our APPS (Nurse Practitioners and Physician Assistants) work with your physician to ensure care continuity for you. They are fully qualified to address your health concerns and develop a treatment plan. They communicate directly with your gastroenterologist to care for you. Seeing the Advanced Practice Practitioners on your physician's team can help you by facilitating care more promptly,  often allowing for earlier appointments, access to diagnostic testing, procedures, and other specialty referrals.   Thank you for trusting me with your gastrointestinal care. Deanna May, FNP-C

## 2024-08-10 NOTE — Progress Notes (Signed)
 Chief Complaint: follow-up altered bowels, anal fissure Primary GI Doctor: Dr. Federico  HPI:  Patient is a  43  year old female patient with past medical history of GERD, depression,anxiety, and migraines, who was referred to me by Dawn Cain, Dawn Cain on 04/24/24 for a complaint of alternating constipation and diarrhea, external hemorrhoids .   Patient last seen by myself on 06/13/2024.  Interval History     Patient presents for follow-up on altered bowel habits and anal fissure.  Patient was prescribed topical diltiazem with lidocaine  TID she used for about 4 weeks and started to feel much better therefore she stopped the medication.  Patient states her symptoms came back a few days later with rectal pain and blood noted with wiping.  Patient restarted the cream this past Saturday and states since then her symptoms have improved.  Patient has been trying to make dietary changes as well as incorporate fiber supplementation but admits it has been a little bit of a challenge.  Patient was recently switched from Zoloft to Wellbutrin due to side effects but states she feels as if she has normalizing.  Patient is scheduled for her first colon screening next week with Dr. Federico.  Nonsmoker. Drinks 1-2 times a year.   She takes Meloxicam prn for knee pain.  Never had EGD or colonoscopy. Never seen by gastroenterologist.   Family hx: Paternal grandfather with stomach CA, paternal grandfather with lung CA  Surgical hx: Ovarian cysts removal x3, ovary and fallopian tube removed, uterine fibroids removed, knee meniscus repair, heel spur  Wt Readings from Last 3 Encounters:  08/10/24 242 lb 6 oz (109.9 kg)  06/13/24 239 lb 4 oz (108.5 kg)  05/24/24 237 lb 8 oz (107.7 kg)     Past Medical History:  Diagnosis Date   Anxiety    Depression    Endometriosis 02/27/2014   dx'd laparoscopically     GERD (gastroesophageal reflux disease)    Headache    Migraines - 2-3x/month   Heel spur, right     Infertility    trying for 20 years.   Lipoma of back    Patellar tendonitis 12/15/2016   PONV (postoperative nausea and vomiting)     Past Surgical History:  Procedure Laterality Date   ACHILLES TENDON SURGERY Right 04/09/2017   Procedure: ACHILLES TENDON REPAIR;  Surgeon: Eva Gay, DPM;  Location: [facility];  Service: Podiatry;  Laterality: Right;   GANGLION CYST EXCISION Right 04/09/2021   Procedure: EXCISION OF MUCOID CYST FROM RIGHT LONG FINGER;  Surgeon: Edie Norleen PARAS, MD;  Location: [facility];  Service: Orthopedics;  Laterality: Right;   HEEL SPUR SURGERY  03/2017   04/09/2017   KNEE ARTHROSCOPY WITH MENISCAL REPAIR Right 07/18/2021   Procedure: Right knee arthroscopic medial meniscus root repair and chondroplasty;  Surgeon: Tobie Priest, MD;  Location: [facility];  Service: Orthopedics;  Laterality: Right;   KNEE CLOSED REDUCTION Right 09/16/2021   Procedure: Right knee arthroscopy, lysis of adhesions, partial medial menisectomy and manipulation under anesthesia;  Surgeon: Tobie Priest, MD;  Location: [facility];  Service: Orthopedics;  Laterality: Right;   LEFT OOPHORECTOMY Left 2013   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   LIPOMA EXCISION Left 10/31/2015   Procedure: EXCISION LIPOMA;  Surgeon: Carlin Pastel, MD;  Location: [facility];  Service: General;  Laterality: Left;   MYOMECTOMY N/A 08/25/2023   Procedure: ABDOMINAL MYOMECTOMY;  Surgeon: Barbette Knock, MD;  Location: [facility];  Service: Gynecology;  Laterality: N/A;   OSTECTOMY Right 04/09/2017   Procedure: OSTECTOMY-[place]/Retrocalcaneal;  Surgeon: Eva Gay, DPM;  Location: [facility];  Service: Podiatry;  Laterality: Right;   OVARIAN CYST REMOVAL Left 05/02/2009   OVARIAN CYST REMOVAL Left 09/23/2006    Current Outpatient Medications  Medication Sig Dispense Refill   acetaminophen  (TYLENOL ) 325 MG tablet Take 2 tablets (650 mg total) by mouth every 4 (four) hours as needed for mild pain (temperature >  101.5.).     AMBULATORY NON FORMULARY MEDICATION Medication Name: Diltiazem 2% with lidocaine . Apply to rectum 3x daily 30 g 1   FIBER ADULT GUMMIES PO Take 2 tablets by mouth daily.     hydrocortisone  (ANUSOL -HC) 25 MG suppository Place 1 suppository (25 mg total) rectally 2 (two) times daily. 12 suppository 0   ibuprofen  (ADVIL ) 600 MG tablet Take 1 tablet (600 mg total) by mouth every 6 (six) hours. 30 tablet 0   meloxicam (MOBIC) 15 MG tablet Take 15 mg by mouth daily as needed for pain.     Multiple Vitamins-Minerals (MULTIVITAMIN WITH MINERALS) tablet Take 1 tablet by mouth 3 (three) times a week.     Rimegepant Sulfate (NURTEC) 75 MG TBDP Take 1 tablet (75 mg total) by mouth every other day as needed (migraines). 16 tablet 3   WELLBUTRIN XL 150 MG 24 hr tablet Take 150 mg by mouth daily.     hydrocortisone -pramoxine (ANALPRAM-HC) 2.5-1 % rectal cream Place 1 Application rectally 3 (three) times daily. (Patient not taking: Reported on 08/10/2024)     No current facility-administered medications for this visit.    Allergies as of 08/10/2024 - Review Complete 08/10/2024  Allergen Reaction Noted   Erythromycin Shortness Of Breath and Swelling 02/27/2014   Escitalopram  Nausea Only 08/10/2024   Imitrex [sumatriptan]  06/05/2015   Sertraline Nausea Only 08/10/2024    Family History  Problem Relation Age of Onset   Hypertension Father    Hyperlipidemia Father    Emphysema Maternal Grandmother    Lung cancer Maternal Grandfather    Heart disease Paternal Grandmother    Stomach cancer Paternal Grandfather     Review of Systems:    Constitutional: No weight loss, fever, chills, weakness or fatigue HEENT: Eyes: No change in vision               Ears, Nose, Throat:  No change in hearing or congestion Skin: No rash or itching Cardiovascular: No chest pain, chest pressure or palpitations   Respiratory: No SOB or cough Gastrointestinal: See HPI and otherwise negative Genitourinary: No  dysuria or change in urinary frequency Neurological: No headache, dizziness or syncope Musculoskeletal: No new muscle or joint pain Hematologic: No bleeding or bruising Psychiatric: No history of depression or anxiety    Physical Exam:  Vital signs: BP 116/80 (BP Location: Left Arm, Patient Position: Sitting, Cuff Size: Large)   Pulse 84   Ht 5' 3.75 (1.619 m)   Wt 242 lb 6 oz (109.9 kg)   LMP 08/10/2024   BMI 41.93 kg/m   Constitutional:   Pleasant  female appears to be in NAD, Well developed, Well nourished, alert and cooperative Throat: Oral cavity and pharynx without inflammation, swelling or lesion.  Respiratory: Respirations even and unlabored. Lungs clear to auscultation bilaterally.   No wheezes, crackles, or rhonchi.  Cardiovascular: Normal S1, S2. Regular rate and rhythm. No peripheral edema, cyanosis or pallor.  Gastrointestinal:  Soft, nondistended, nontender. No rebound or guarding. Normal bowel sounds. No appreciable masses or hepatomegaly.  Rectal: Not performed Msk:  Symmetrical without gross deformities. Without edema, no deformity or joint abnormality.  Neurologic:  Alert and  oriented x4;  grossly normal neurologically.  Skin:   Dry and intact without significant lesions or rashes. Psychiatric: Oriented to person, place and time. Demonstrates good judgement and reason without abnormal affect or behaviors.  RELEVANT LABS AND IMAGING: CBC    Latest Ref Rng & Units 08/26/2023    3:27 AM 08/18/2023    3:30 PM 03/31/2021    7:21 AM  CBC  WBC 4.0 - 10.5 K/uL 11.4  6.8  6.0   Hemoglobin 12.0 - 15.0 g/dL 89.1  88.6  87.1   Hematocrit 36.0 - 46.0 % 34.5  36.2  39.1   Platelets 150 - 400 K/uL 221  248  211      CMP     Latest Ref Rng & Units 08/18/2023    3:30 PM 03/31/2021    7:21 AM 09/12/2019    9:38 AM  CMP  Glucose 70 - 99 mg/dL 897  90  95   BUN 6 - 20 mg/dL 11  12  13    Creatinine 0.44 - 1.00 mg/dL 9.27  9.32  9.22   Sodium 135 - 145 mmol/L 140  139  141    Potassium 3.5 - 5.1 mmol/L 4.2  4.2  4.6   Chloride 98 - 111 mmol/L 106  105  104   CO2 22 - 32 mmol/L 25  19  25    Calcium 8.9 - 10.3 mg/dL 9.6  9.0  9.6   Total Protein 6.0 - 8.5 g/dL  7.0  7.6   Total Bilirubin 0.0 - 1.2 mg/dL  0.3  0.5   Alkaline Phos 44 - 121 IU/L  57  67   AST 0 - 40 IU/L  16  15   ALT 0 - 32 IU/L  16  13      Lab Results  Component Value Date   TSH 2.690 06/13/2024   06/13/24 labs show: CRP-3, TTG IgA-neg, IgA-neg, TSH -2.690  Assessment: Encounter Diagnoses  Name Primary?   Altered bowel habits Yes   Anal fissure    Rectal bleeding    Rectal pain    Special screening for malignant neoplasms, colon        43 year old female patient who presents for follow-up for altered bowel habits and anal fissure.  Patient initially was doing well with topical diltiazem with lidocaine  however when patient stopped the medication her symptoms returned.  Patient recently restarted topical ointment and symptoms have improved.  Patient is due for colon screening colonoscopy next week and can be further evaluated at that time.  Discussed with patient if she continues to have issues we Dawn Cain consider switching her to a different type of topical ointment.  Patient also was noted at last appointment to have external skin tags and we discussed surgical options which she wanted to hold off on.  Lab work at last appointment revealed negative celiac panel and normal CRP level.  Thyroid level was also normal.  We revisited low FODMAP diet and incorporating fiber supplementation to help regulate her bowels as she fluctuates between diarrhea and constipation.  We can revisit at follow-up.  Plan: - Recommend Low fodmap diet  -Continue Citrucel 1 tsp po daily -Continue topical diltiazem with lidocaine  TID -Sitz baths TID -Scheduled for a colonoscopy in [facility] with Dr. Federico next week. The risks and benefits of colonoscopy with possible polypectomy /  biopsies were discussed and the patient agrees  to proceed. Consider hemorrhoid banding if internal hemorrhoids present - We discussed surgical referral for external skin tags, she would like to hold off for now. -follow-up 2-73mths with APP  Thank you for the courtesy of this consult. Please call me with any questions or concerns.   Dawn Rymer, FNP-C Dawn Cain 08/10/2024, 4:11 PM  Cc: Dawn Cain, Dawn Cain

## 2024-08-15 NOTE — Progress Notes (Signed)
 I agree with the assessment and plan as outlined by Ms. May.

## 2024-08-18 ENCOUNTER — Other Ambulatory Visit: Payer: Self-pay | Admitting: Lactation Services

## 2024-08-18 ENCOUNTER — Encounter: Payer: Self-pay | Admitting: Internal Medicine

## 2024-08-18 ENCOUNTER — Ambulatory Visit: Admitting: Internal Medicine

## 2024-08-18 VITALS — BP 148/97 | HR 78 | Temp 97.7°F | Resp 14

## 2024-08-18 DIAGNOSIS — K602 Anal fissure, unspecified: Secondary | ICD-10-CM

## 2024-08-18 DIAGNOSIS — K648 Other hemorrhoids: Secondary | ICD-10-CM | POA: Diagnosis not present

## 2024-08-18 DIAGNOSIS — Z1211 Encounter for screening for malignant neoplasm of colon: Secondary | ICD-10-CM

## 2024-08-18 DIAGNOSIS — R197 Diarrhea, unspecified: Secondary | ICD-10-CM

## 2024-08-18 MED ORDER — SODIUM CHLORIDE 0.9 % IV SOLN: 500.0000 mL | Freq: Once | INTRAVENOUS | Status: DC

## 2024-08-18 MED ORDER — NIFEDIPINE 0.3 % OINTMENT: 1.0000 | TOPICAL_OINTMENT | Freq: Four times a day (QID) | CUTANEOUS | 0 refills | Status: DC

## 2024-08-18 NOTE — Progress Notes (Signed)
 Sedate, gd SR, tolerated procedure well, VSS, report to RN

## 2024-08-18 NOTE — Patient Instructions (Addendum)
 YOU HAD AN ENDOSCOPIC PROCEDURE TODAY AT THE [facility]:   Refer to the procedure report that was given to you for any specific questions about what was found during the examination.  If the procedure report does not answer your questions, please call your gastroenterologist to clarify.  If you requested that your care partner not be given the details of your procedure findings, then the procedure report has been included in a sealed envelope for you to review at your convenience later.  YOU SHOULD EXPECT: Some feelings of bloating in the abdomen. Passage of more gas than usual.  Walking can help get rid of the air that was put into your GI tract during the procedure and reduce the bloating. If you had a lower endoscopy (such as a colonoscopy or flexible sigmoidoscopy) you may notice spotting of blood in your stool or on the toilet paper. If you underwent a bowel prep for your procedure, you may not have a normal bowel movement for a few days.  Please Note:  You might notice some irritation and congestion in your nose or some drainage.  This is from the oxygen used during your procedure.  There is no need for concern and it should clear up in a day or so.  SYMPTOMS TO REPORT IMMEDIATELY:  Following lower endoscopy (colonoscopy or flexible sigmoidoscopy):  Excessive amounts of blood in the stool  Significant tenderness or worsening of abdominal pains  Swelling of the abdomen that is new, acute  Fever of 100F or higher  Await pathology results Okay to switch from diltiazem ointment to nifedipine  0.2% ointment mixed with lidocaine  5%.  Using your index finger, please apply a small amount of medication up to your first knuckle three times a day for 8 weeks. Pick up at [facility] Return to GI clinic in 2-3 months Handout on hemorrhoids given   For urgent or emergent issues, a gastroenterologist can be reached at any hour by calling [phone]. Do not use  MyChart messaging for urgent concerns.    DIET:  We do recommend a small meal at first, but then you may proceed to your regular diet.  Drink plenty of fluids but you should avoid alcoholic beverages for 24 hours.  ACTIVITY:  You should plan to take it easy for the rest of today and you should NOT DRIVE or use heavy machinery until tomorrow (because of the sedation medicines used during the test).    FOLLOW UP: Our staff will call the number listed on your records the next business day following your procedure.  We will call around 7:15- 8:00 am to check on you and address any questions or concerns that you may have regarding the information given to you following your procedure. If we do not reach you, we will leave a message.     If any biopsies were taken you will be contacted by phone or by letter within the next 1-3 weeks.  Please call us  at [phone] if you have not heard about the biopsies in 3 weeks.    SIGNATURES/CONFIDENTIALITY: You and/or your care partner have signed paperwork which will be entered into your electronic medical record.  These signatures attest to the fact that that the information above on your After Visit Summary has been reviewed and is understood.  Full responsibility of the confidentiality of this discharge information lies with you and/or your care-partner.

## 2024-08-18 NOTE — Op Note (Addendum)
 [facility] Patient Name: Dawn Cain Procedure Date: 08/18/2024 9:19 AM MRN: [id number] Endoscopist: Rosario Estefana Kidney , , [id number] Age: 43 Referring MD:  Date of Birth: 1981/04/13 Gender: Female Account #: [id number] Procedure:                Colonoscopy Indications:              Screening for colorectal malignant neoplasm, This                            is the patient's first colonoscopy, Incidental -                            Diarrhea Medicines:                Monitored Anesthesia Care Procedure:                Pre-Anesthesia Assessment:                           - Prior to the procedure, a History and Physical                            was performed, and patient medications and                            allergies were reviewed. The patient's tolerance of                            previous anesthesia was also reviewed. The risks                            and benefits of the procedure and the sedation                            options and risks were discussed with the patient.                            All questions were answered, and informed consent                            was obtained. Prior Anticoagulants: The patient has                            taken no anticoagulant or antiplatelet agents. ASA                            Grade Assessment: III - A patient with severe                            systemic disease. After reviewing the risks and                            benefits, the patient was deemed in satisfactory  condition to undergo the procedure.                           After obtaining informed consent, the colonoscope                            was passed under direct vision. Throughout the                            procedure, the patient's blood pressure, pulse, and                            oxygen saturations were monitored continuously. The                            Olympus Scope [id number] was introduced through  the                            anus and advanced to the the terminal ileum. The                            colonoscopy was performed without difficulty. The                            patient tolerated the procedure well. The quality                            of the bowel preparation was excellent. The                            terminal ileum, ileocecal valve, appendiceal                            orifice, and rectum were photographed. Scope In: 9:23:36 AM Scope Out: 9:37:55 AM Scope Withdrawal Time: 0 hours 10 minutes 52 seconds  Total Procedure Duration: 0 hours 14 minutes 19 seconds  Findings:                 The terminal ileum appeared normal.                           Non-bleeding internal hemorrhoids were found during                            retroflexion.                           An anal fissure was found on perianal exam.                           Biopsies for histology were taken with a cold                            forceps from the entire colon for evaluation of  microscopic colitis. Complications:            No immediate complications. Estimated Blood Loss:     Estimated blood loss was minimal. Impression:               - The examined portion of the ileum was normal.                           - Non-bleeding internal hemorrhoids.                           - Anal fissure found on perianal exam.                           - Biopsies were taken with a cold forceps from the                            entire colon for evaluation of microscopic colitis. Recommendation:           - Discharge patient to home (with escort).                           - Await pathology results.                           - Okay to switch from diltiazem ointment to                            nifedipine  0.2% ointment mixed with lidocaine  5%.                            Using your index finger, please apply a small                            amount of medication up to your  first knuckle TID                            for 8 weeks.                           - Return to GI clinic in 2-3 months.                           - Repeat colonoscopy in 10 years for screening                            purposes.                           - The findings and recommendations were discussed                            with the patient. Dr Estefana Federico Rosario Estefana Federico,  08/18/2024 9:44:16 AM

## 2024-08-18 NOTE — Progress Notes (Signed)
 GASTROENTEROLOGY PROCEDURE H&P NOTE   Primary Care Physician: Donzella Lauraine SAILOR, DO    Reason for Procedure:   Colon cancer screening  Plan:    Colonoscopy  Patient is appropriate for endoscopic procedure(s) in the ambulatory ([facility]) setting.  The nature of the procedure, as well as the risks, benefits, and alternatives were carefully and thoroughly reviewed with the patient. Ample time for discussion and questions allowed. The patient understood, was satisfied, and agreed to proceed.     HPI: Dawn Cain is a 43 y.o. female who presents for colonoscopy for evaluation of colon cancer screening.  Patient was most recently seen in the Gastroenterology Clinic on 08/10/24.  No interval change in medical history since that appointment. Please refer to that note for full details regarding GI history and clinical presentation.   Past Medical History:  Diagnosis Date   Anxiety    Depression    Endometriosis 02/27/2014   dx'd laparoscopically     GERD (gastroesophageal reflux disease)    Headache    Migraines - 2-3x/month   Heel spur, right    Infertility    trying for 20 years.   Lipoma of back    Patellar tendonitis 12/15/2016   PONV (postoperative nausea and vomiting)     Past Surgical History:  Procedure Laterality Date   ACHILLES TENDON SURGERY Right 04/09/2017   Procedure: ACHILLES TENDON REPAIR;  Surgeon: Eva Gay, DPM;  Location: [facility];  Service: Podiatry;  Laterality: Right;   GANGLION CYST EXCISION Right 04/09/2021   Procedure: EXCISION OF MUCOID CYST FROM RIGHT LONG FINGER;  Surgeon: Edie Norleen PARAS, MD;  Location: [facility];  Service: Orthopedics;  Laterality: Right;   HEEL SPUR SURGERY  03/2017   04/09/2017   KNEE ARTHROSCOPY WITH MENISCAL REPAIR Right 07/18/2021   Procedure: Right knee arthroscopic medial meniscus root repair and chondroplasty;  Surgeon: Tobie Priest, MD;  Location: [facility];  Service: Orthopedics;  Laterality: Right;   KNEE CLOSED  REDUCTION Right 09/16/2021   Procedure: Right knee arthroscopy, lysis of adhesions, partial medial menisectomy and manipulation under anesthesia;  Surgeon: Tobie Priest, MD;  Location: [facility];  Service: Orthopedics;  Laterality: Right;   LEFT OOPHORECTOMY Left 2013   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   LIPOMA EXCISION Left 10/31/2015   Procedure: EXCISION LIPOMA;  Surgeon: Carlin Pastel, MD;  Location: [facility];  Service: General;  Laterality: Left;   MYOMECTOMY N/A 08/25/2023   Procedure: ABDOMINAL MYOMECTOMY;  Surgeon: Barbette Knock, MD;  Location: [facility];  Service: Gynecology;  Laterality: N/A;   OSTECTOMY Right 04/09/2017   Procedure: OSTECTOMY-[place]/Retrocalcaneal;  Surgeon: Eva Gay, DPM;  Location: [facility];  Service: Podiatry;  Laterality: Right;   OVARIAN CYST REMOVAL Left 05/02/2009   OVARIAN CYST REMOVAL Left 09/23/2006    Prior to Admission medications   Medication Sig Start Date End Date Taking? Authorizing Provider  acetaminophen  (TYLENOL ) 325 MG tablet Take 2 tablets (650 mg total) by mouth every 4 (four) hours as needed for mild pain (temperature > 101.5.). 08/26/23   Barbette Knock, MD  AMBULATORY NON FORMULARY MEDICATION Medication Name: Diltiazem 2% with lidocaine . Apply to rectum 3x daily 06/13/24   May, Deanna J, NP  FIBER ADULT GUMMIES PO Take 2 tablets by mouth daily.    [provider]  hydrocortisone  (ANUSOL -HC) 25 MG suppository Place 1 suppository (25 mg total) rectally 2 (two) times daily. 04/24/24   Donzella Lauraine SAILOR, DO  hydrocortisone -pramoxine [facility]) 2.5-1 % rectal  cream Place 1 Application rectally 3 (three) times daily. Patient not taking: Reported on 08/10/2024 04/12/24   [provider]  ibuprofen  (ADVIL ) 600 MG tablet Take 1 tablet (600 mg total) by mouth every 6 (six) hours. 08/26/23   Barbette Knock, MD  meloxicam (MOBIC) 15 MG tablet Take 15 mg by mouth daily as needed for pain. 10/05/22   [provider]   Multiple Vitamins-Minerals (MULTIVITAMIN WITH MINERALS) tablet Take 1 tablet by mouth 3 (three) times a week.    [provider]  Rimegepant Sulfate (NURTEC) 75 MG TBDP Take 1 tablet (75 mg total) by mouth every other day as needed (migraines). 04/24/24   Pardue, Lauraine SAILOR, DO  WELLBUTRIN XL 150 MG 24 hr tablet Take 150 mg by mouth daily. 08/01/24   [provider]    Current Outpatient Medications  Medication Sig Dispense Refill   acetaminophen  (TYLENOL ) 325 MG tablet Take 2 tablets (650 mg total) by mouth every 4 (four) hours as needed for mild pain (temperature > 101.5.).     FIBER ADULT GUMMIES PO Take 2 tablets by mouth daily.     Multiple Vitamins-Minerals (MULTIVITAMIN WITH MINERALS) tablet Take 1 tablet by mouth 3 (three) times a week.     Rimegepant Sulfate (NURTEC) 75 MG TBDP Take 1 tablet (75 mg total) by mouth every other day as needed (migraines). 16 tablet 3   WELLBUTRIN XL 150 MG 24 hr tablet Take 150 mg by mouth daily.     AMBULATORY NON FORMULARY MEDICATION Medication Name: Diltiazem 2% with lidocaine . Apply to rectum 3x daily 30 g 1   hydrocortisone  (ANUSOL -HC) 25 MG suppository Place 1 suppository (25 mg total) rectally 2 (two) times daily. 12 suppository 0   hydrocortisone -pramoxine (ANALPRAM-HC) 2.5-1 % rectal cream Place 1 Application rectally 3 (three) times daily. (Patient not taking: Reported on 08/10/2024)     ibuprofen  (ADVIL ) 600 MG tablet Take 1 tablet (600 mg total) by mouth every 6 (six) hours. 30 tablet 0   meloxicam (MOBIC) 15 MG tablet Take 15 mg by mouth daily as needed for pain.     Current Facility-Administered Medications  Medication Dose Route Frequency Provider Last Rate Last Admin   0.9 %  sodium chloride  infusion  500 mL Intravenous Once Rorey Bisson C, MD        Allergies as of 08/18/2024 - Review Complete 08/18/2024  Allergen Reaction Noted   Erythromycin Shortness Of Breath and Swelling 02/27/2014   Escitalopram  Nausea Only  08/10/2024   Imitrex [sumatriptan] Other (See Comments) 06/05/2015   Sertraline Nausea Only 08/10/2024    Family History  Problem Relation Age of Onset   Hypertension Father    Hyperlipidemia Father    Emphysema Maternal Grandmother    Lung cancer Maternal Grandfather    Heart disease Paternal Grandmother    Stomach cancer Paternal Grandfather    Colon cancer Neg Hx    Esophageal cancer Neg Hx    Rectal cancer Neg Hx     Social History   Socioeconomic History   Marital status: Married    Spouse name: Evalene   Number of children: 0   Years of education: Not on file   Highest education level: Bachelor's degree (e.g., BA, AB, BS)  Occupational History   Not on file  Tobacco Use   Smoking status: Never    Passive exposure: Never   Smokeless tobacco: Never  Vaping Use   Vaping status: Never Used  Substance and Sexual Activity   Alcohol  use: Yes    Alcohol/week: 2.0 standard drinks of alcohol    Types: 2 Standard drinks or equivalent per week    Comment: rare   Drug use: No   Sexual activity: Yes    Partners: Male    Birth control/protection: None  Other Topics Concern   Not on file  Social History Narrative   Not on file   Social Drivers of Health   Financial Resource Strain: Low Risk  (05/24/2024)   Overall Financial Resource Strain (CARDIA)    Difficulty of Paying Living Expenses: Not hard at all  Food Insecurity: No Food Insecurity (05/24/2024)   Hunger Vital Sign    Worried About Running Out of Food in the Last Year: Never true    Ran Out of Food in the Last Year: Never true  Transportation Needs: No Transportation Needs (05/24/2024)   PRAPARE - Administrator, Civil Service (Medical): No    Lack of Transportation (Non-Medical): No  Physical Activity: Insufficiently Active (05/24/2024)   Exercise Vital Sign    Days of Exercise per Week: 3 days    Minutes of Exercise per Session: 30 min  Stress: No Stress Concern Present (05/24/2024)   [employer] of Occupational Health - Occupational Stress Questionnaire    Feeling of Stress : Only a little  Recent Concern: Stress - Stress Concern Present (04/24/2024)   [employer] of Occupational Health - Occupational Stress Questionnaire    Feeling of Stress : Very much  Social Connections: Socially Integrated (05/24/2024)   Social Connection and Isolation Panel    Frequency of Communication with Friends and Family: More than three times a week    Frequency of Social Gatherings with Friends and Family: Once a week    Attends Religious Services: More than 4 times per year    Active Member of [employer] or Organizations: Yes    Attends Engineer, structural: More than 4 times per year    Marital Status: Married  Catering manager Violence: Not At Risk (04/24/2024)   Humiliation, Afraid, Rape, and Kick questionnaire    Fear of Current or Ex-Partner: No    Emotionally Abused: No    Physically Abused: No    Sexually Abused: No    Physical Exam: Vital signs in last 24 hours: BP 126/75   Pulse 70   Temp 97.7 F (36.5 C) (Temporal)   LMP 08/10/2024 (Exact Date)   SpO2 98%  GEN: NAD EYE: Sclerae anicteric ENT: MMM CV: Non-tachycardic Pulm: No increased WOB GI: Soft NEURO:  Alert & Oriented   Estefana Kidney, MD [facility]   08/18/2024 9:20 AM

## 2024-08-18 NOTE — Progress Notes (Signed)
 Pt's states no medical or surgical changes since previsit or office visit.

## 2024-08-21 ENCOUNTER — Telehealth: Payer: Self-pay | Admitting: *Deleted

## 2024-08-21 NOTE — Telephone Encounter (Signed)
 Attempted post procedure follow up call.  No answer - LVM.

## 2024-08-22 ENCOUNTER — Ambulatory Visit: Payer: Self-pay | Admitting: Internal Medicine

## 2024-08-22 LAB — SURGICAL PATHOLOGY

## 2024-08-23 ENCOUNTER — Other Ambulatory Visit: Payer: Self-pay | Admitting: Emergency Medicine

## 2024-08-23 ENCOUNTER — Encounter: Payer: Self-pay | Admitting: Emergency Medicine

## 2024-08-23 MED ORDER — AMBULATORY NON FORMULARY MEDICATION: 1 refills | Status: AC

## 2024-08-24 ENCOUNTER — Ambulatory Visit
Admission: RE | Admit: 2024-08-24 | Discharge: 2024-08-24 | Disposition: A | Discharge status: Home / Self Care | Source: Ambulatory Visit | Attending: Obstetrics & Gynecology | Admitting: Obstetrics & Gynecology

## 2024-08-24 ENCOUNTER — Other Ambulatory Visit: Payer: Self-pay | Admitting: Obstetrics & Gynecology

## 2024-08-24 DIAGNOSIS — N6489 Other specified disorders of breast: Secondary | ICD-10-CM

## 2024-08-24 DIAGNOSIS — R928 Other abnormal and inconclusive findings on diagnostic imaging of breast: Secondary | ICD-10-CM

## 2024-08-24 HISTORY — PX: BREAST BIOPSY: SHX20

## 2024-08-25 LAB — SURGICAL PATHOLOGY

## 2024-10-03 ENCOUNTER — Telehealth: Payer: Self-pay

## 2024-10-03 NOTE — Telephone Encounter (Signed)
 Pharmacy Patient Advocate Encounter   Received notification from Onbase that prior authorization for Nurtec 75MG  dispersible tablets  is required/requested.   Insurance verification completed.   The patient is insured through [facility]   Per test claim: PA required; PA submitted to above mentioned insurance via Latent Key/confirmation #/EOC [id number] Status is pending

## 2024-10-04 ENCOUNTER — Other Ambulatory Visit (HOSPITAL_COMMUNITY): Payer: Self-pay

## 2024-10-04 NOTE — Telephone Encounter (Signed)
 Pharmacy Patient Advocate Encounter  Received notification from [facility] that Prior Authorization for Nurtec 75MG  dispersible tablets has been APPROVED to 10.21.27. Ran test claim, Copay is $RTS, RX WAS LAST FILLED ON TODAY 10/22 . This test claim was processed through [facility]- copay amounts may vary at other pharmacies due to pharmacy/plan contracts, or as the patient moves through the different stages of their insurance plan.   PA #/Case ID/Reference #: [id number]

## 2024-10-10 NOTE — Telephone Encounter (Signed)
 Pt stated that she has a painful cyst and request for a sooner appointment.  Pt to be scheduled on 10/19/2024 at 1:30 PM to see Alan Coombs PA. Pt made aware. ( 7 day hold). Pt verbalized understanding with all questions answered.

## 2024-10-13 NOTE — Telephone Encounter (Signed)
 Appointment booked

## 2024-10-19 ENCOUNTER — Encounter: Payer: Self-pay | Admitting: Physician Assistant

## 2024-10-19 ENCOUNTER — Ambulatory Visit: Admitting: Physician Assistant

## 2024-10-19 VITALS — BP 138/82 | HR 97 | Ht 64.0 in | Wt 242.0 lb

## 2024-10-19 DIAGNOSIS — K602 Anal fissure, unspecified: Secondary | ICD-10-CM | POA: Diagnosis not present

## 2024-10-19 DIAGNOSIS — K6289 Other specified diseases of anus and rectum: Secondary | ICD-10-CM | POA: Diagnosis not present

## 2024-10-19 DIAGNOSIS — L732 Hidradenitis suppurativa: Secondary | ICD-10-CM

## 2024-10-19 DIAGNOSIS — K580 Irritable bowel syndrome with diarrhea: Secondary | ICD-10-CM

## 2024-10-19 MED ORDER — DOXYCYCLINE HYCLATE 100 MG PO TABS: 100.0000 mg | ORAL_TABLET | Freq: Two times a day (BID) | ORAL | 0 refills | Status: AC

## 2024-10-19 NOTE — Patient Instructions (Signed)
 First do a trial off milk/lactose products if you use them.  Add fiber like benefiber or citracel once a day Increase activity   VISIT SUMMARY:  Today, we addressed your rectal pain and bleeding, which are related to internal hemorrhoids, an anal fissure, and a resolving perianal abscess. We also discussed your symptoms of irritable bowel syndrome and hidradenitis suppurativa.  YOUR PLAN:  PERIANAL ABSCESS: You have a resolving perianal abscess that has decreased in size and is less tender. -Continue warm sitz baths. -Take doxycycline  twice daily for 10 days. -You may use hydrocortisone  cream if desired. -Follow up in 2-4 weeks or sooner if symptoms worsen.  ANAL FISSURE AND INTERNAL HEMORRHOIDS: Your anal fissure and internal hemorrhoids have shown improvement with nifedipine  and lidocaine , but occasional pain and spasms persist. -Continue using nifedipine  with lidocaine  as prescribed.  IRRITABLE BOWEL SYNDROME WITH DIARRHEA: You have symptoms of bloating, gas, and alternating stools, which are consistent with irritable bowel syndrome with diarrhea. -Continue following the FODMAP diet. -Take doxycycline  as an alternative treatment for IBS with diarrhea.  HIDRADENITIS SUPPURATIVA: You have recurrent breakouts in the inguinal area, and previous treatments have provided limited relief. -Take doxycycline  twice daily for 10 days. -Consider seeing a dermatologist for further management.    FODMAP stands for fermentable oligo-, di-, mono-saccharides and polyols (1). These are the scientific terms used to classify groups of carbs that are difficult for our body to digest and that are notorious for triggering digestive symptoms like bloating, gas, loose stools and stomach pain.   You can try low FODMAP diet  - start with eliminating just one column at a time that you feel may be a trigger for you. - the table at the very bottom contains foods that are low in FODMAPs   Sometimes trying to  eliminate the FODMAP's from your diet is difficult or tricky, if you are stuggling with trying to do the elimination diet you can try an enzyme.  There is a food enzymes that you sprinkle in or on your food that helps break down the FODMAP. You can read more about the enzyme by going to this site: https://fodzyme.com/

## 2024-10-19 NOTE — Progress Notes (Signed)
 10/19/2024 Dawn Cain [id number] January 19, 1981  Referring provider: Donzella Lauraine SAILOR, DO Primary GI doctor: Dr. Federico  ASSESSMENT AND PLAN:  Posterior small rectal abscess versus external With history of HS hemorrhoid, appears to have a punctate head on it, no induration, unable to express any mucus/blood, very tender Felt grape size tender mass on rectum Can have rectal pain intermittent - doxycycline  100 mg BID for 10 days - sitz bathes - follow up 1 month - follow up with dermatologist - consider MRI or [facility] referral if not healed or worsening   Irritable bowel syndrome with diarrhea Symptoms include bloating, gas, alternating stools. Discussed Xifaxan trial, insurance may be an issue. Doxycycline  considered as alternative. - Continue FODMAP diet. - Prescribed doxycycline  as alternative treatment for IBS with diarrhea.  Hidradenitis suppurativa Recurrent inguinal breakouts. Previous antibacterial soap provided limited relief. Doxycycline  considered for dual benefit with IBS. - Prescribed doxycycline  for 10 days, twice daily. - Offered referral to dermatologist for further management.  [facility] 08/18/2024 colonoscopy excellent bowel prep normal TI nonbleeding internal hemorrhoids anal fissure recall 10 years, negative for microscopic colitis or dysplasia  Anal fissure/internal hemorrhoids Treated with diltiazem lidocaine  Switch to nifedipine  with lidocaine  with improvement of her symptoms, less bleeding On exam, has healed, did not do anoscopy  Patient Care Team: Donzella Lauraine SAILOR, DO as PCP - General (Family Medicine) Izell Harari, MD as Consulting Physician (Obstetrics and Gynecology) Mliss Bihari (Unknown Physician Specialty)  HISTORY OF PRESENT ILLNESS: 43 y.o. female with a past medical history listed below presents for evaluation of rectal pain.   Last seen in the office 08/10/2024 by Cathryne Beal, NP for rectal bleeding and abnormal bowel habits.  Discussed the  use of AI scribe software for clinical note transcription with the patient, who gave verbal consent to proceed.  History of Present Illness   Dawn Cain is a 44 year old female with internal hemorrhoids and anal fissure who presents with rectal pain and bleeding.  She has a history of internal hemorrhoids and an anal fissure, for which she was prescribed nifedipine  with lidocaine  following a colonoscopy on September 5th. Initially, she experienced improvement with less bleeding. However, about a week and a half to two weeks ago, she noticed a large, tender mass near the rectum, described as a 'hemorrhoid or cyst'. The mass has fluctuated in size and tenderness, causing sharp, intense pain that occasionally prevents movement. Recently, she has experienced some bleeding as the mass subsides.  Her bowel movements are variable, with some days of loose stools and others of constipation. She is actively trying to identify dietary triggers, noting that beans and onions may exacerbate her symptoms. She reports bloating and gas but denies any urinary issues such as burning or frequency, although she does wake up once at night to urinate.  Her past medical history includes hidradenitis suppurativa, and she reports recurrent breakouts in the same areas. She is currently taking fiber supplements to help regulate her bowel movements.      She  reports that she has never smoked. She has never been exposed to tobacco smoke. She has never used smokeless tobacco. She reports current alcohol use of about 2.0 standard drinks of alcohol per week. She reports that she does not use drugs.  RELEVANT GI HISTORY, IMAGING AND LABS: Results   DIAGNOSTIC Colonoscopy: Internal hemorrhoids and anal fissure (08/18/2024)      CBC    Component Value Date/Time   WBC 11.4 (H) 08/26/2023 0327  RBC 4.14 08/26/2023 0327   HGB 10.8 (L) 08/26/2023 0327   HGB 12.8 03/31/2021 0721   HCT 34.5 (L) 08/26/2023 0327   HCT 39.1  03/31/2021 0721   PLT 221 08/26/2023 0327   PLT 211 03/31/2021 0721   MCV 83.3 08/26/2023 0327   MCV 84 03/31/2021 0721   MCH 26.1 08/26/2023 0327   MCHC 31.3 08/26/2023 0327   RDW 14.2 08/26/2023 0327   RDW 14.3 03/31/2021 0721   LYMPHSABS 1.6 03/31/2021 0721   EOSABS 0.2 03/31/2021 0721   BASOSABS 0.0 03/31/2021 0721   No results for input(s): HGB in the last 8760 hours.  CMP     Component Value Date/Time   NA 140 08/18/2023 1530   NA 139 03/31/2021 0721   K 4.2 08/18/2023 1530   CL 106 08/18/2023 1530   CO2 25 08/18/2023 1530   GLUCOSE 102 (H) 08/18/2023 1530   BUN 11 08/18/2023 1530   BUN 12 03/31/2021 0721   CREATININE 0.72 08/18/2023 1530   CALCIUM 9.6 08/18/2023 1530   PROT 7.0 03/31/2021 0721   ALBUMIN 4.1 03/31/2021 0721   AST 16 03/31/2021 0721   ALT 16 03/31/2021 0721   ALKPHOS 57 03/31/2021 0721   BILITOT 0.3 03/31/2021 0721   GFRNONAA >60 08/18/2023 1530   GFRAA 112 09/12/2019 0938      Latest Ref Rng & Units 03/31/2021    7:21 AM 09/12/2019    9:38 AM 09/27/2017    4:33 PM  Hepatic Function  Total Protein 6.0 - 8.5 g/dL 7.0  7.6  8.0   Albumin 3.8 - 4.8 g/dL 4.1  4.4  4.8   AST 0 - 40 IU/L 16  15  18    ALT 0 - 32 IU/L 16  13  14    Alk Phosphatase 44 - 121 IU/L 57  67  70   Total Bilirubin 0.0 - 1.2 mg/dL 0.3  0.5  0.3       Current Medications:      Current Outpatient Medications (Analgesics):    acetaminophen  (TYLENOL ) 325 MG tablet, Take 2 tablets (650 mg total) by mouth every 4 (four) hours as needed for mild pain (temperature > 101.5.).   meloxicam (MOBIC) 15 MG tablet, Take 15 mg by mouth daily as needed for pain.   Rimegepant Sulfate (NURTEC) 75 MG TBDP, Take 1 tablet (75 mg total) by mouth every other day as needed (migraines).   Current Outpatient Medications (Other):    AMBULATORY NON FORMULARY MEDICATION, Medication Name: Nifedipine  0.2% /lidocaine  5% Apply small amount of medication up to first knuckle 3 times a day for 8  weeks.   doxycycline  (VIBRA -TABS) 100 MG tablet, Take 1 tablet (100 mg total) by mouth 2 (two) times daily.   FIBER ADULT GUMMIES PO, Take 2 tablets by mouth daily.   Multiple Vitamins-Minerals (MULTIVITAMIN WITH MINERALS) tablet, Take 1 tablet by mouth 3 (three) times a week.   WELLBUTRIN XL 150 MG 24 hr tablet, Take 150 mg by mouth daily.  Medical History:  Past Medical History:  Diagnosis Date   Anxiety    Depression    Endometriosis 02/27/2014   dx'd laparoscopically     GERD (gastroesophageal reflux disease)    Headache    Migraines - 2-3x/month   Heel spur, right    Infertility    trying for 20 years.   Lipoma of back    Patellar tendonitis 12/15/2016   PONV (postoperative nausea and vomiting)    Allergies:  Allergies  Allergen Reactions   Erythromycin Shortness Of Breath and Swelling    Chest pain   Escitalopram  Nausea Only    migraines   Imitrex [Sumatriptan] Other (See Comments)    neck muscle tightness   Sertraline Nausea Only    Migraines     Surgical History:  She  has a past surgical history that includes Left oophorectomy (Left, 2013); Ovarian cyst removal (Left, 05/02/2009); Ovarian cyst removal (Left, 09/23/2006); Lipoma excision (Left, 10/31/2015); Achilles tendon surgery (Right, 04/09/2017); Ostectomy (Right, 04/09/2017); Heel spur surgery (03/2017); Ganglion cyst excision (Right, 04/09/2021); Knee arthroscopy with meniscal repair (Right, 07/18/2021); Knee Closed Reduction (Right, 09/16/2021); Myomectomy (N/A, 08/25/2023); and Breast biopsy (Left, 08/24/2024). Family History:  Her family history includes Emphysema in her maternal grandmother; Heart disease in her paternal grandmother; Hyperlipidemia in her father; Hypertension in her father; Lung cancer in her maternal grandfather; Stomach cancer in her paternal grandfather.  REVIEW OF SYSTEMS  : All other systems reviewed and negative except where noted in the History of Present Illness.  PHYSICAL EXAM: BP  138/82   Pulse 97   Ht 5' 4 (1.626 m)   Wt 242 lb (109.8 kg)   LMP 10/02/2024   SpO2 98%   BMI 41.54 kg/m  Physical Exam   GENERAL APPEARANCE: Well nourished, in no apparent distress. HEENT: No cervical lymphadenopathy, unremarkable thyroid, sclerae anicteric, conjunctiva pink. RESPIRATORY: Respiratory effort normal, breath sounds equal bilaterally without rales, rhonchi, or wheezing. CARDIO: Regular rate and rhythm with no murmurs, rubs, or gallops, peripheral pulses intact. ABDOMEN: Soft, non-distended, active bowel sounds in all four quadrants, no tenderness to palpation, no rebound, no mass appreciated. RECTAL: Possible external hemorrhoid or small abscess on posterior anus, no induration around the anus, rectum non-tender on palpation, anus has a punctate head, possibly from an abscess, internal exam without internal abscess or tenderness. MUSCULOSKELETAL: Full range of motion, normal gait, without edema. SKIN: Dry, intact without rashes or lesions. No jaundice. NEURO: Alert, oriented, no focal deficits. PSYCH: Cooperative, normal mood and affect.      Alan JONELLE Coombs, PA-C 1:55 PM

## 2024-10-25 ENCOUNTER — Ambulatory Visit: Admitting: Physician Assistant

## 2024-11-15 ENCOUNTER — Encounter

## 2024-11-27 ENCOUNTER — Ambulatory Visit: Admitting: Physician Assistant

## 2024-12-26 ENCOUNTER — Ambulatory Visit: Admitting: Physician Assistant

## 2025-06-11 ENCOUNTER — Ambulatory Visit: Admitting: Physician Assistant
